# Patient Record
Sex: Female | Born: 1960 | Race: White | Hispanic: No | Marital: Married | Smoking: Never smoker
Health system: Southern US, Community
[De-identification: ages and names within clinical notes are randomized; demographics above are authoritative.]

## PROBLEM LIST (undated history)

## (undated) DIAGNOSIS — G43909 Migraine, unspecified, not intractable, without status migrainosus: Secondary | ICD-10-CM

## (undated) DIAGNOSIS — J302 Other seasonal allergic rhinitis: Secondary | ICD-10-CM

## (undated) DIAGNOSIS — C801 Malignant (primary) neoplasm, unspecified: Secondary | ICD-10-CM

## (undated) DIAGNOSIS — R7303 Prediabetes: Secondary | ICD-10-CM

## (undated) DIAGNOSIS — E785 Hyperlipidemia, unspecified: Secondary | ICD-10-CM

## (undated) DIAGNOSIS — F32A Depression, unspecified: Secondary | ICD-10-CM

## (undated) DIAGNOSIS — J45909 Unspecified asthma, uncomplicated: Secondary | ICD-10-CM

## (undated) DIAGNOSIS — Z853 Personal history of malignant neoplasm of breast: Secondary | ICD-10-CM

## (undated) DIAGNOSIS — F411 Generalized anxiety disorder: Secondary | ICD-10-CM

## (undated) DIAGNOSIS — M1990 Unspecified osteoarthritis, unspecified site: Secondary | ICD-10-CM

## (undated) DIAGNOSIS — C50919 Malignant neoplasm of unspecified site of unspecified female breast: Secondary | ICD-10-CM

## (undated) DIAGNOSIS — F419 Anxiety disorder, unspecified: Secondary | ICD-10-CM

## (undated) DIAGNOSIS — E7800 Pure hypercholesterolemia, unspecified: Secondary | ICD-10-CM

## (undated) DIAGNOSIS — F329 Major depressive disorder, single episode, unspecified: Secondary | ICD-10-CM

## (undated) HISTORY — DX: Hyperlipidemia, unspecified: E78.5

## (undated) HISTORY — DX: Depression, unspecified: F32.A

## (undated) HISTORY — DX: Prediabetes: R73.03

## (undated) HISTORY — DX: Generalized anxiety disorder: F41.1

## (undated) HISTORY — DX: Migraine, unspecified, not intractable, without status migrainosus: G43.909

## (undated) HISTORY — PX: CHOLECYSTECTOMY: SHX55

## (undated) HISTORY — DX: Personal history of malignant neoplasm of breast: Z85.3

## (undated) HISTORY — DX: Other seasonal allergic rhinitis: J30.2

## (undated) HISTORY — DX: Major depressive disorder, single episode, unspecified: F32.9

## (undated) HISTORY — DX: Malignant neoplasm of unspecified site of unspecified female breast: C50.919

## (undated) HISTORY — DX: Pure hypercholesterolemia, unspecified: E78.00

## (undated) HISTORY — DX: Anxiety disorder, unspecified: F41.9

## (undated) HISTORY — DX: Unspecified osteoarthritis, unspecified site: M19.90

## (undated) HISTORY — PX: TUBAL LIGATION: SHX77

## (undated) HISTORY — PX: OTHER SURGICAL HISTORY: SHX169

---

## 2000-03-05 ENCOUNTER — Other Ambulatory Visit: Admission: RE | Admit: 2000-03-05 | Discharge: 2000-03-05 | Payer: Self-pay | Admitting: Family Medicine

## 2015-03-30 ENCOUNTER — Ambulatory Visit: Payer: Self-pay | Admitting: General Surgery

## 2015-03-30 NOTE — H&P (Signed)
History of Present Illness Carly Spruce MD; 03/30/2015 11:33 AM) Patient words: New-Hernia.  The patient is a 55 year old female who presents with an incisional hernia. 55 year old female who has noticed an enlarging bulge in her umbilicus. She had a cholecystectomy performed approximately 10 years ago. She gets early satiety and occasional nausea. The bulge is also uncomfortable causing some odd pains. He has normal bowel movements. She does not smoke she is not a diabetic she has no immunologic diseases. He has not been evaluated for colonoscopy. She has lost 10 pounds in the last month purposefully.  She also notes dysphagia having feelings things get stuck in her mid chest often relieved by vomiting this occurs around 2 times per month. She's not had previous evaluation she has had on EGD performed for ulcer disease in the past. Her dysphagia is both liquids and solids.  Referred by Dr. Rica Records   Other Problems Carly Farmer, CMA; 03/30/2015 10:52 AM) Back Pain Cholelithiasis Gastric Ulcer Gastroesophageal Reflux Disease Hemorrhoids High blood pressure Hypercholesterolemia  Past Surgical History Carly Farmer, CMA; 03/30/2015 10:52 AM) Gallbladder Surgery - Laparoscopic  Diagnostic Studies History Carly Farmer, CMA; 03/30/2015 10:52 AM) Colonoscopy never Pap Smear 1-5 years ago  Allergies Carly Farmer, CMA; 03/30/2015 10:53 AM) Codeine Sulfate *ANALGESICS - OPIOID*  Medication History (Carly Farmer, CMA; 03/30/2015 10:53 AM) Atorvastatin Calcium (20MG  Tablet, Oral) Active. Fluticasone Propionate (50MCG/ACT Suspension, Nasal) Active. Montelukast Sodium (10MG  Tablet, Oral) Active. Vitamin D (Ergocalciferol) (50000UNIT Capsule, Oral) Active. Medications Reconciled  Social History Carly Farmer, CMA; 03/30/2015 10:52 AM) Alcohol use Occasional alcohol use. Caffeine use Tea. No drug use Tobacco use Never smoker.  Family History Carly Farmer, CMA; 03/30/2015 10:52 AM) Arthritis Father, Mother. Heart Disease Father. Hypertension Brother, Father, Mother. Prostate Cancer Father.  Pregnancy / Birth History Carly Farmer, CMA; 03/30/2015 10:52 AM) Age at menarche 72 years. Age of menopause 70-50 Gravida 2 Maternal age 67-25 Para 2    Review of Systems Carly Farmer CMA; 03/30/2015 10:52 AM) General Not Present- Appetite Loss, Chills, Fatigue, Fever, Night Sweats, Weight Gain and Weight Loss. Skin Not Present- Change in Wart/Mole, Dryness, Hives, Jaundice, New Lesions, Non-Healing Wounds, Rash and Ulcer. HEENT Present- Seasonal Allergies and Wears glasses/contact lenses. Not Present- Earache, Hearing Loss, Hoarseness, Nose Bleed, Oral Ulcers, Ringing in the Ears, Sinus Pain, Sore Throat, Visual Disturbances and Yellow Eyes. Cardiovascular Not Present- Chest Pain, Difficulty Breathing Lying Down, Leg Cramps, Palpitations, Rapid Heart Rate, Shortness of Breath and Swelling of Extremities. Gastrointestinal Present- Difficulty Swallowing. Not Present- Abdominal Pain, Bloating, Bloody Stool, Change in Bowel Habits, Chronic diarrhea, Constipation, Excessive gas, Gets full quickly at meals, Hemorrhoids, Indigestion, Nausea, Rectal Pain and Vomiting. Female Genitourinary Present- Urgency. Not Present- Frequency, Nocturia, Painful Urination and Pelvic Pain. Musculoskeletal Present- Joint Pain. Not Present- Back Pain, Joint Stiffness, Muscle Pain, Muscle Weakness and Swelling of Extremities. Neurological Not Present- Decreased Memory, Fainting, Headaches, Numbness, Seizures, Tingling, Tremor, Trouble walking and Weakness. Psychiatric Not Present- Anxiety, Bipolar, Change in Sleep Pattern, Depression, Fearful and Frequent crying. Endocrine Not Present- Cold Intolerance, Excessive Hunger, Hair Changes, Heat Intolerance, Hot flashes and New Diabetes. Hematology Not Present- Easy Bruising, Excessive bleeding, Gland problems, HIV and  Persistent Infections.  Vitals (Carly Farmer CMA; 03/30/2015 10:53 AM) 03/30/2015 10:52 AM Weight: 239.8 lb Height: 67in Body Surface Area: 2.18 m Body Mass Index: 37.56 kg/m  Temp.: 97.46F(Oral)  Pulse: 68 (Regular)  BP: 140/78 (Sitting, Left Arm, Standard)       Physical Exam (Carly Laning A.  Aldea Avis MD; 03/30/2015 11:31 AM) General Mental Status-Alert. General Appearance-Cooperative. Orientation-Oriented X4. Posture-Normal posture.  Integumentary Global Assessment Normal Exam - Head/Face: no rashes, ulcers, lesions or evidence of photo damage. No palpable nodules or masses and Neck: no visible lesions or palpable masses.  Head and Neck Head-normocephalic, atraumatic with no lesions or palpable masses. Face Global Assessment - atraumatic. Thyroid Gland Characteristics - normal size and consistency.  Eye Eyeball - Bilateral-Extraocular movements intact. Sclera/Conjunctiva - Bilateral-No scleral icterus, No Discharge.  ENMT Nose and Sinuses Nose - no deformities observed, no swelling present.  Chest and Lung Exam Palpation Normal exam - Non-tender. Auscultation Breath sounds - Normal.  Cardiovascular Auscultation Rhythm - Regular. Heart Sounds - S1 WNL and S2 WNL. Carotid arteries - No Carotid bruit.  Abdomen Inspection Normal Exam - No Visible peristalsis, No Abnormal pulsations and No Paradoxical movements. Palpation/Percussion Normal exam - Soft, Non Tender, No Rebound tenderness, No Rigidity (guarding), No hepatosplenomegaly and No Palpable abdominal masses. Note: 2 cm periumbilical defect   Peripheral Vascular Upper Extremity Palpation - Pulses bilaterally normal. Lower Extremity Palpation - Edema - Bilateral - No edema.  Neurologic Neurologic evaluation reveals -normal sensation and normal coordination.  Neuropsychiatric Mental status exam performed with findings of-able to articulate well with normal speech/language,  rate, volume and coherence and thought content normal with ability to perform basic computations and apply abstract reasoning.  Musculoskeletal Normal Exam - Bilateral-Upper Extremity Strength Normal and Lower Extremity Strength Normal.    Assessment & Plan Carly Spruce MD; 03/30/2015 11:33 AM) Carly Farmer HERNIA, WITHOUT OBSTRUCTION OR GANGRENE (K43.2) Impression: 77-year-old female with small symptomatic incisional hernia. We discussed the etiologies of hernias that do not: Themselves to take a larger over time that the large especially for people who have obesity, smoke or other diabetic or immunologic diseases. Discussed the risk of obstruction or strangulation his hernias proximally 1% over 5 years. This options for surgical repair and I recommended laparoscopic repair with mesh. Discussed the details of the procedure including risks of recurrence of 10%, risk of infection, risk of intra-abdominal organ injury, risk of bleeding, as well as discomfort with healing. She showed good understanding and wanted to proceed. We discussed the timing of this would be completely up to her and she is elected to wait until approximately mid March is I recommended that she complete her colonoscopy prior to repair in case there is finding the requiring surgery or additional procedure. -Separate incisional hernia repair possible overnight stay Current Plans Pt Education - Carly Farmer - Hernia Surgery: discussed with patient and provided information. The anatomy & physiology of the abdominal wall was discussed. The pathophysiology of hernias was discussed. Natural history risks without surgery including progeressive enlargement, pain, incarceration, & strangulation was discussed. Contributors to complications such as smoking, obesity, diabetes, prior surgery, etc were discussed.  I feel the risks of no intervention will lead to serious problems that outweigh the operative risks; therefore, I recommended  surgery to reduce and repair the hernia. I explained laparoscopic techniques with possible need for an open approach. I noted the probable use of mesh to patch and/or buttress the hernia repair  Risks such as bleeding, infection, abscess, need for further treatment, heart attack, death, and other risks were discussed. I noted a good likelihood this will help address the problem. Goals of post-operative recovery were discussed as well. Possibility that this will not correct all symptoms was explained. I stressed the importance of low-impact activity, aggressive pain control, avoiding constipation, & not  pushing through pain to minimize risk of post-operative chronic pain or injury. Possibility of reherniation especially with smoking, obesity, diabetes, immunosuppression, and other health conditions was discussed. We will work to minimize complications.  An educational handout further explaining the pathology & treatment options was Farmer as well. Questions were answered. The patient expresses understanding & wishes to proceed with surgery.  You are being scheduled for surgery - Our schedulers will call you.  You should hear from our office's scheduling department within 5 working days about the location, date, and time of surgery. We try to make accommodations for patient's preferences in scheduling surgery, but sometimes the OR schedule or the surgeon's schedule prevents [place] from making those accommodations.  If you have not heard from our office [phone]) in 5 working days, call the office and ask for your surgeon's nurse.  If you have other questions about your diagnosis, plan, or surgery, call the office and ask for your surgeon's nurse.  DYSPHAGIA (R13.10) Impression: She has dysphagia for solids and liquids which has not previously been evaluated. She is most interested in working up the hernia at this time but I said it is a good idea to begin evaluation of this as well. Current  Plans BARIUM SWALLOW [phone]) (dysphagia, want view of entire esophagus and at least upper stomach) Follow up with [place] in the office in 1 month.  Call us sooner as needed.

## 2015-04-06 ENCOUNTER — Other Ambulatory Visit: Payer: Self-pay | Admitting: General Surgery

## 2015-04-06 DIAGNOSIS — R4702 Dysphasia: Secondary | ICD-10-CM

## 2017-04-07 DIAGNOSIS — M545 Low back pain: Secondary | ICD-10-CM | POA: Diagnosis not present

## 2017-04-07 DIAGNOSIS — J0100 Acute maxillary sinusitis, unspecified: Secondary | ICD-10-CM | POA: Diagnosis not present

## 2017-04-07 DIAGNOSIS — J209 Acute bronchitis, unspecified: Secondary | ICD-10-CM | POA: Diagnosis not present

## 2017-04-10 DIAGNOSIS — J209 Acute bronchitis, unspecified: Secondary | ICD-10-CM | POA: Diagnosis not present

## 2017-04-10 DIAGNOSIS — J0100 Acute maxillary sinusitis, unspecified: Secondary | ICD-10-CM | POA: Diagnosis not present

## 2017-06-11 DIAGNOSIS — S239XXA Sprain of unspecified parts of thorax, initial encounter: Secondary | ICD-10-CM | POA: Diagnosis not present

## 2017-09-21 DIAGNOSIS — G43909 Migraine, unspecified, not intractable, without status migrainosus: Secondary | ICD-10-CM | POA: Diagnosis not present

## 2017-09-21 DIAGNOSIS — M1990 Unspecified osteoarthritis, unspecified site: Secondary | ICD-10-CM | POA: Diagnosis not present

## 2017-11-03 DIAGNOSIS — K219 Gastro-esophageal reflux disease without esophagitis: Secondary | ICD-10-CM | POA: Diagnosis not present

## 2017-11-03 DIAGNOSIS — M25552 Pain in left hip: Secondary | ICD-10-CM | POA: Diagnosis not present

## 2017-11-03 DIAGNOSIS — Z0000 Encounter for general adult medical examination without abnormal findings: Secondary | ICD-10-CM | POA: Diagnosis not present

## 2017-11-06 ENCOUNTER — Other Ambulatory Visit: Payer: Self-pay | Admitting: Student

## 2017-11-06 DIAGNOSIS — Z1231 Encounter for screening mammogram for malignant neoplasm of breast: Secondary | ICD-10-CM

## 2017-11-30 DIAGNOSIS — R7303 Prediabetes: Secondary | ICD-10-CM | POA: Diagnosis not present

## 2017-11-30 DIAGNOSIS — E782 Mixed hyperlipidemia: Secondary | ICD-10-CM | POA: Diagnosis not present

## 2017-12-09 ENCOUNTER — Ambulatory Visit
Admission: RE | Admit: 2017-12-09 | Discharge: 2017-12-09 | Disposition: A | Discharge status: Home / Self Care | Payer: 59 | Source: Ambulatory Visit | Attending: Student | Admitting: Student

## 2017-12-09 ENCOUNTER — Encounter: Payer: Self-pay | Admitting: Radiology

## 2017-12-09 DIAGNOSIS — Z1231 Encounter for screening mammogram for malignant neoplasm of breast: Secondary | ICD-10-CM

## 2017-12-09 DIAGNOSIS — K648 Other hemorrhoids: Secondary | ICD-10-CM | POA: Diagnosis not present

## 2017-12-10 ENCOUNTER — Other Ambulatory Visit: Payer: Self-pay | Admitting: Student

## 2017-12-10 DIAGNOSIS — R928 Other abnormal and inconclusive findings on diagnostic imaging of breast: Secondary | ICD-10-CM

## 2017-12-14 ENCOUNTER — Ambulatory Visit
Admission: RE | Admit: 2017-12-14 | Discharge: 2017-12-14 | Disposition: A | Discharge status: Home / Self Care | Payer: 59 | Source: Ambulatory Visit | Attending: Student | Admitting: Student

## 2017-12-14 ENCOUNTER — Other Ambulatory Visit: Payer: Self-pay | Admitting: Student

## 2017-12-14 DIAGNOSIS — N651 Disproportion of reconstructed breast: Secondary | ICD-10-CM | POA: Diagnosis not present

## 2017-12-14 DIAGNOSIS — R928 Other abnormal and inconclusive findings on diagnostic imaging of breast: Secondary | ICD-10-CM

## 2017-12-14 DIAGNOSIS — N6489 Other specified disorders of breast: Secondary | ICD-10-CM

## 2017-12-15 DIAGNOSIS — C801 Malignant (primary) neoplasm, unspecified: Secondary | ICD-10-CM

## 2017-12-15 HISTORY — DX: Malignant (primary) neoplasm, unspecified: C80.1

## 2017-12-18 ENCOUNTER — Ambulatory Visit
Admission: RE | Admit: 2017-12-18 | Discharge: 2017-12-18 | Disposition: A | Discharge status: Home / Self Care | Payer: 59 | Source: Ambulatory Visit | Attending: Student | Admitting: Student

## 2017-12-18 DIAGNOSIS — N6322 Unspecified lump in the left breast, upper inner quadrant: Secondary | ICD-10-CM | POA: Diagnosis not present

## 2017-12-18 DIAGNOSIS — N6489 Other specified disorders of breast: Secondary | ICD-10-CM

## 2017-12-18 DIAGNOSIS — C50212 Malignant neoplasm of upper-inner quadrant of left female breast: Secondary | ICD-10-CM | POA: Diagnosis not present

## 2017-12-22 ENCOUNTER — Telehealth: Payer: Self-pay | Admitting: Hematology and Oncology

## 2017-12-22 NOTE — Telephone Encounter (Signed)
LVM for patient in reference to morning BC on 10/16, advised packet sent both e-mail and mail.

## 2017-12-23 ENCOUNTER — Telehealth: Payer: Self-pay | Admitting: Hematology and Oncology

## 2017-12-23 ENCOUNTER — Encounter: Payer: Self-pay | Admitting: *Deleted

## 2017-12-23 DIAGNOSIS — C50212 Malignant neoplasm of upper-inner quadrant of left female breast: Secondary | ICD-10-CM | POA: Insufficient documentation

## 2017-12-23 DIAGNOSIS — Z170 Estrogen receptor positive status [ER+]: Principal | ICD-10-CM

## 2017-12-23 NOTE — Telephone Encounter (Signed)
Patient returned call to confirm the morning [facility] appointment on 10/16, advised to send packet through mail

## 2017-12-24 DIAGNOSIS — Z6836 Body mass index (BMI) 36.0-36.9, adult: Secondary | ICD-10-CM | POA: Diagnosis not present

## 2017-12-24 DIAGNOSIS — C50919 Malignant neoplasm of unspecified site of unspecified female breast: Secondary | ICD-10-CM | POA: Diagnosis not present

## 2017-12-24 DIAGNOSIS — E669 Obesity, unspecified: Secondary | ICD-10-CM | POA: Diagnosis not present

## 2017-12-30 ENCOUNTER — Other Ambulatory Visit: Payer: Self-pay

## 2017-12-30 ENCOUNTER — Telehealth: Payer: Self-pay | Admitting: Hematology and Oncology

## 2017-12-30 ENCOUNTER — Encounter: Payer: Self-pay | Admitting: Hematology and Oncology

## 2017-12-30 ENCOUNTER — Encounter: Payer: Self-pay | Admitting: Physical Therapy

## 2017-12-30 ENCOUNTER — Ambulatory Visit: Payer: 59 | Attending: General Surgery | Admitting: Physical Therapy

## 2017-12-30 ENCOUNTER — Inpatient Hospital Stay: Payer: 59

## 2017-12-30 ENCOUNTER — Ambulatory Visit: Payer: Self-pay | Admitting: General Surgery

## 2017-12-30 ENCOUNTER — Ambulatory Visit
Admission: RE | Admit: 2017-12-30 | Discharge: 2017-12-30 | Disposition: A | Discharge status: Home / Self Care | Payer: 59 | Source: Ambulatory Visit | Attending: Radiation Oncology | Admitting: Radiation Oncology

## 2017-12-30 ENCOUNTER — Inpatient Hospital Stay: Payer: 59 | Attending: Hematology and Oncology | Admitting: Hematology and Oncology

## 2017-12-30 ENCOUNTER — Encounter: Payer: Self-pay | Admitting: *Deleted

## 2017-12-30 DIAGNOSIS — C50212 Malignant neoplasm of upper-inner quadrant of left female breast: Secondary | ICD-10-CM

## 2017-12-30 DIAGNOSIS — Z170 Estrogen receptor positive status [ER+]: Principal | ICD-10-CM

## 2017-12-30 DIAGNOSIS — F329 Major depressive disorder, single episode, unspecified: Secondary | ICD-10-CM | POA: Diagnosis not present

## 2017-12-30 DIAGNOSIS — C50912 Malignant neoplasm of unspecified site of left female breast: Secondary | ICD-10-CM | POA: Diagnosis not present

## 2017-12-30 DIAGNOSIS — R293 Abnormal posture: Secondary | ICD-10-CM | POA: Insufficient documentation

## 2017-12-30 DIAGNOSIS — Z803 Family history of malignant neoplasm of breast: Secondary | ICD-10-CM | POA: Diagnosis not present

## 2017-12-30 DIAGNOSIS — F419 Anxiety disorder, unspecified: Secondary | ICD-10-CM | POA: Diagnosis not present

## 2017-12-30 LAB — CBC WITH DIFFERENTIAL (CANCER CENTER ONLY)
Abs Immature Granulocytes: 0 10*3/uL (ref 0.00–0.07)
BASOS PCT: 1 %
Basophils Absolute: 0.1 10*3/uL (ref 0.0–0.1)
EOS ABS: 0.2 10*3/uL (ref 0.0–0.5)
EOS PCT: 4 %
HEMATOCRIT: 40.8 % (ref 36.0–46.0)
Hemoglobin: 12.9 g/dL (ref 12.0–15.0)
Immature Granulocytes: 0 %
LYMPHS ABS: 1.9 10*3/uL (ref 0.7–4.0)
Lymphocytes Relative: 35 %
MCH: 28.9 pg (ref 26.0–34.0)
MCHC: 31.6 g/dL (ref 30.0–36.0)
MCV: 91.5 fL (ref 80.0–100.0)
MONOS PCT: 7 %
Monocytes Absolute: 0.4 10*3/uL (ref 0.1–1.0)
NRBC: 0 % (ref 0.0–0.2)
Neutro Abs: 2.8 10*3/uL (ref 1.7–7.7)
Neutrophils Relative %: 53 %
Platelet Count: 231 10*3/uL (ref 150–400)
RBC: 4.46 MIL/uL (ref 3.87–5.11)
RDW: 13.1 % (ref 11.5–15.5)
WBC Count: 5.3 10*3/uL (ref 4.0–10.5)

## 2017-12-30 LAB — CMP (CANCER CENTER ONLY)
ALBUMIN: 3.6 g/dL (ref 3.5–5.0)
ALT: 14 U/L (ref 0–44)
AST: 12 U/L — ABNORMAL LOW (ref 15–41)
Alkaline Phosphatase: 75 U/L (ref 38–126)
Anion gap: 9 (ref 5–15)
BUN: 12 mg/dL (ref 6–20)
CALCIUM: 9.2 mg/dL (ref 8.9–10.3)
CO2: 27 mmol/L (ref 22–32)
CREATININE: 1.07 mg/dL — AB (ref 0.44–1.00)
Chloride: 106 mmol/L (ref 98–111)
GFR, Estimated: 57 mL/min — ABNORMAL LOW (ref >60)
Glucose, Bld: 110 mg/dL — ABNORMAL HIGH (ref 70–99)
Potassium: 3.7 mmol/L (ref 3.5–5.1)
SODIUM: 142 mmol/L (ref 135–145)
TOTAL PROTEIN: 6.5 g/dL (ref 6.5–8.1)
Total Bilirubin: 0.5 mg/dL (ref 0.3–1.2)

## 2017-12-30 NOTE — Assessment & Plan Note (Signed)
12/18/2017:Screening mammogram detected left breast distortion at 11 o'clock position measured 8 mm by ultrasound.  Axilla negative.  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  T1bN0 stage Ia  Pathology and radiology counseling:Discussed with the patient, the details of pathology including the type of breast cancer,the clinical staging, the significance of ER, PR and HER-2/neu receptors and the implications for treatment. After reviewing the pathology in detail, we proceeded to discuss the different treatment options between surgery, radiation, chemotherapy, antiestrogen therapies.  Recommendations: 1. Breast conserving surgery with sentinel lymph node biopsy followed by 2. Oncotype DX testing to determine if chemotherapy would be of any benefit followed by 3. Adjuvant radiation therapy followed by 4. Adjuvant antiestrogen therapy  Oncotype counseling: I discussed Oncotype DX test. I explained to the patient that this is a 21 gene panel to evaluate patient tumors DNA to calculate recurrence score. This would help determine whether patient has high risk or intermediate risk or low risk breast cancer. She understands that if her tumor was found to be high risk, she would benefit from systemic chemotherapy. If low risk, no need of chemotherapy. If she was found to be intermediate risk, we would need to evaluate the score as well as other risk factors and determine if an abbreviated chemotherapy may be of benefit.  Return to clinic after surgery to discuss final pathology report and then determine if Oncotype DX testing will need to be sent.

## 2017-12-30 NOTE — Therapy (Signed)
[place], [place], [address] Phone: [phone]   Fax:  [phone]  Physical Therapy Evaluation  Patient Details  Name: Carly Farmer MRN: [id number] Date of Birth: 05/03/1960 Referring Provider (PT): Dr. Excell Seltzer   Encounter Date: 12/30/2017  PT End of Session - 12/30/17 1106    Visit Number  1    Number of Visits  2    Date for PT Re-Evaluation  02/24/18    PT Start Time  0917    PT Stop Time  0944    PT Time Calculation (min)  27 min    Activity Tolerance  Patient tolerated treatment well    Behavior During Therapy  [facility] for tasks assessed/performed       Past Medical History:  Diagnosis Date  . Anxiety   . Arthritis   . Depression   . High cholesterol     Past Surgical History:  Procedure Laterality Date  . CHOLECYSTECTOMY    . TUBAL LIGATION    . wisdom teeth removal      There were no vitals filed for this visit.   Subjective Assessment - 12/30/17 1043    Subjective  Patient reports she is here today to be seen by her medical team for her newly diagnosed left breast cancer.    Patient is accompained by:  Family member    Pertinent History  Patient was diagnosed on 12/09/17 with left grade II invasive ductal carcinoma breast cancer. It emasures 8 mm and is located in the upper inner quadrant. It is ER/PR positive and HER2 negative with a Ki67 of 12%. She has no other known medical problems.    Patient Stated Goals  Reduce lymphedema risk and learn post op shoulder ROM HEP    Currently in Pain?  Yes    Pain Score  5     Pain Location  Hip    Pain Orientation  Right;Left    Pain Descriptors / Indicators  Throbbing    Pain Type  Chronic pain    Pain Radiating Towards  back    Pain Onset  More than a month ago    Pain Frequency  Intermittent    Aggravating Factors   Walking and standing    Pain Relieving Factors  Rest         [facility] PT Assessment - 12/30/17 0001       Assessment   Medical Diagnosis  Left breast cancer    Referring Provider (PT)  Dr. Excell Seltzer    Onset Date/Surgical Date  12/09/17    Hand Dominance  Right    Prior Therapy  none      Precautions   Precautions  Other (comment)    Precaution Comments  Active cancer      Restrictions   Weight Bearing Restrictions  No      Balance Screen   Has the patient fallen in the past 6 months  No    Has the patient had a decrease in activity level because of a fear of falling?   No    Is the patient reluctant to leave their home because of a fear of falling?   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, walks alot and climbs ladders    Leisure  She does not exercise      Cognition   Overall Cognitive Status  Within Functional Limits for tasks assessed      Posture/Postural Control   Posture/Postural Control  Postural limitations    Postural Limitations  Rounded Shoulders;Forward head      ROM / Strength   AROM / PROM / Strength  AROM;Strength      AROM   AROM Assessment Site  Shoulder;Cervical    Right/Left Shoulder  Right;Left    Right Shoulder Extension  60 Degrees    Right Shoulder Flexion  168 Degrees    Right Shoulder ABduction  179 Degrees    Right Shoulder Internal Rotation  68 Degrees    Right Shoulder External Rotation  90 Degrees    Left Shoulder Extension  56 Degrees    Left Shoulder Flexion  169 Degrees    Left Shoulder ABduction  180 Degrees    Left Shoulder Internal Rotation  67 Degrees    Left Shoulder External Rotation  89 Degrees    Cervical Flexion  WNL    Cervical Extension  WNL    Cervical - Right Side Bend  WNL    Cervical - Left Side Bend   WNL    Cervical - Right Rotation  WNL    Cervical - Left Rotation  WNL      Strength   Overall Strength  Within functional limits for tasks performed        LYMPHEDEMA/ONCOLOGY QUESTIONNAIRE - 12/30/17 1104      Type   Cancer Type  Left breast cancer      Lymphedema Assessments   Lymphedema Assessments  Upper extremities      Right Upper Extremity Lymphedema   10 cm Proximal to Olecranon Process  32.2 cm    Olecranon Process  26.6 cm    10 cm Proximal to Ulnar Styloid Process  26.5 cm    Just Proximal to Ulnar Styloid Process  18.6 cm    Across Hand at [employer]  20 cm    At [place] of 2nd Digit  6.6 cm      Left Upper Extremity Lymphedema   10 cm Proximal to Olecranon Process  33.2 cm    Olecranon Process  26.9 cm    10 cm Proximal to Ulnar Styloid Process  25.4 cm    Just Proximal to Ulnar Styloid Process  18.5 cm    Across Hand at [employer]  19.8 cm    At [place] of 2nd Digit  6.5 cm             Objective measurements completed on examination: See above findings.     Patient was instructed today in a home exercise program today for post op shoulder range of motion. These included active assist shoulder flexion in sitting, scapular retraction, wall walking with shoulder abduction, and hands behind head external rotation.  She was encouraged to do these twice a day, holding 3 seconds and repeating 5 times when permitted by her physician.       PT Education - 12/30/17 1106    Education Details  Lymphedema risk reduction and post op shoulder ROM HEP    Person(s) Educated  Patient;Spouse    Methods  Explanation;Demonstration;Handout    Comprehension  Verbalized understanding;Returned demonstration          PT Long Term Goals - 12/30/17 1118      PT LONG TERM GOAL #1   Title  Patient will demonstrate she ha regained full shoulder ROM and function post operatively compared to baseline measurements.    Time  8    Period  Weeks    Status  New       Breast Clinic Goals - 12/30/17 1118      Patient will be able to verbalize understanding of pertinent lymphedema risk reduction practices relevant to her diagnosis specifically related to skin care.   Time  1    Period  Days    Status  Achieved      Patient will be able to return demonstrate and/or verbalize understanding of the post-op home exercise program related to regaining shoulder range of motion.   Time  1    Period  Days    Status  Achieved      Patient will be able to verbalize understanding of the importance of attending the postoperative After Breast Cancer Class for further lymphedema risk reduction education and therapeutic exercise.   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 It measures 8 mm and is located in the upper inner quadrant. It is ER/PR positive and HER2 negative with a Ki67 of 12%. She has no other known medical problems. Her multidisciplinary medical team met prior to her assessments to determine a recommended treatment plan. She is planning to have a left lumpectomy and sentinel node biopsy followed by Oncotype testing, radiation, and anti-estrogen therapy. She will benefit from a post op PT visit to reassess and determine needs.    Clinical Presentation  Stable    Clinical Decision Making  Low    Rehab Potential  Excellent    Clinical Impairments Affecting Rehab Potential  None    PT Frequency  --   Eval and 1 f/u visit   PT Treatment/Interventions  ADLs/Self Care Home Management;Therapeutic exercise;Patient/family education    PT Next Visit Plan  Will reassess 3-4 weeks post op    PT Home Exercise Plan  Post op shoulder ROM HEP    Consulted and Agree with Plan of Care  Patient;Family member/caregiver    Family Member Consulted  Husband and daughter       Patient will benefit from  skilled therapeutic intervention in order to improve the following deficits and impairments:  Decreased range of motion, Postural dysfunction, Pain, Impaired UE functional use, Decreased knowledge of precautions  Visit Diagnosis: Malignant neoplasm of upper-inner quadrant of left breast in female, estrogen receptor positive ([place]) - Plan: PT plan of care cert/re-cert  Abnormal posture - Plan: PT plan of care cert/re-cert   Patient will follow up at outpatient cancer rehab 3-4 weeks following surgery.  If the patient requires physical therapy at that time, a specific plan will be dictated and sent to the referring physician for approval. The patient was educated today on appropriate basic range of motion exercises to begin post operatively and the importance of attending the After Breast Cancer class following surgery.  Patient was educated today on lymphedema risk reduction practices as it pertains to recommendations that will benefit the patient  immediately following surgery.  She verbalized good understanding.      Problem List Patient Active Problem List   Diagnosis Date Noted  . Malignant neoplasm of upper-inner quadrant of left breast in female, estrogen receptor positive ([place]) 12/23/2017    Annia Friendly, PT 12/30/17 11:20 AM  [place], [place], [address] Phone: [phone]   Fax:  [phone]  Name: Carly Farmer MRN: [id number] Date of Birth: July 07, 1960

## 2017-12-30 NOTE — Progress Notes (Signed)
Nutrition Assessment  Reason for Assessment:  Pt seen in Breast Clinic  ASSESSMENT:   57 year old female with new diagnosis of breast cancer.  Past medical history reviewed.   Medications:  reviewed  Labs: reviewed  Anthropometrics:   Height: 66 inches Weight: 232 lb  BMI: 37.5   NUTRITION DIAGNOSIS: Food and nutrition related knowledge deficit related to new diagnosis of breast cancer as evidenced by no prior need for nutrition related information.  INTERVENTION:   Discussed and provided packet of information regarding nutritional tips for breast cancer patients.  Questions answered.  Teachback method used.  Contact information provided and patient knows to contact me with questions/concerns.    MONITORING, EVALUATION, and GOAL: Pt will consume a healthy plant based diet to maintain lean body mass throughout treatment.   Tammy Ericsson B. Zenia Resides, [place], [place] Registered Dietitian [phone] (pager)

## 2017-12-30 NOTE — Telephone Encounter (Signed)
No 10/16 los orders.  °

## 2017-12-30 NOTE — Progress Notes (Signed)
Clinical Social Work [place] Psychosocial Distress Screening [place]  Patient completed distress screening protocol and scored a 5 on the Psychosocial Distress Thermometer which indicates moderate distress. Clinical Social Worker met with patient and patients family in [facility] to assess for distress and other psychosocial needs. Patient stated she was feeling overwhelmed but felt "better" after meeting with the treatment team and getting more information on her treatment plan. CSW and patient discussed common feeling and emotions when being diagnosed with cancer, and the importance of support during treatment. CSW informed patient of the support team and support services at [facility]. CSW provided contact information and encouraged patient to call with any questions or concerns.  ONCBCN DISTRESS SCREENING 12/30/2017  Screening Type Initial Screening  Distress experienced in past week (1-10) 5  Emotional problem type Depression;Nervousness/Anxiety  Information Concerns Type Lack of info about diagnosis;Lack of info about treatment     Johnnye Lana, MSW, LCSW, OSW-C Clinical Social Worker [facility] [phone]

## 2017-12-30 NOTE — Patient Instructions (Signed)

## 2017-12-30 NOTE — Progress Notes (Signed)
Radiation Oncology         [phone] ________________________________  Multidisciplinary Breast Oncology Clinic [facility]) Initial Outpatient Consultation  Name: Carly Farmer MRN: [id number]  Date: 12/30/2017  DOB: 03/22/1960  KV:[id number], Geni Bers, NP  Excell Seltzer, MD   REFERRING PHYSICIAN: Excell Seltzer, MD  DIAGNOSIS: The encounter diagnosis was Malignant neoplasm of upper-inner quadrant of left breast in female, estrogen receptor positive ([place]). 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 female who is presenting to the office today for evaluation of her newly diagnosed breast cancer. She is doing well overall.   She had routine screening mammography on 12/09/2017 showing: possible distortion in the left breast. She underwent unilateral left diagnostic mammography with tomography and left breast ultrasonography at [facility] on 12/14/2017 showing: UPPER INNER LEFT breast distortion with 0.8 cm sonographic correlate. Tissue sampling is recommended. No abnormal appearing LEFT axillary lymph nodes  Accordingly on 12/18/2017 she proceeded to biopsy of the left breast area in question. The pathology from this procedure showed: Breast, left, needle core biopsy, 11 o'clock with invasive ductal carcinoma. Ductal carcinoma in situ. Prognostic indicators significant for: estrogen receptor, 100% positive and progesterone receptor, 90% positive, both with strong staining intensity. Proliferation marker Ki67 at 12%. HER2 negative.  On review of systems, She reports exertional SOB (when walking up stairs), back  pain, joint pain, anxiety, and depression. She denies any other symptoms. She denies a family hx of breast cancer.    Menarche: 57 years old Age at first live birth: 57 years old GP: GxP2 LMP: 2011 Contraceptive: Yes, for 2 years HRT: No   The patient was referred today for presentation in the multidisciplinary conference.  Radiology studies and pathology slides were presented there for review and discussion of treatment options.  A consensus was discussed regarding potential next steps.  PREVIOUS RADIATION THERAPY: No  PAST MEDICAL HISTORY:  has a past medical history of Anxiety, Arthritis, Depression, and High cholesterol.    PAST SURGICAL HISTORY: Past Surgical History:  Procedure Laterality Date  . CHOLECYSTECTOMY    . TUBAL LIGATION    . wisdom teeth removal      FAMILY HISTORY: family history includes Bone cancer in her father; Breast cancer in her maternal aunt; Prostate cancer in her father.  SOCIAL HISTORY:  reports that she has never smoked. She does not have any smokeless tobacco history on file. She reports that she drinks alcohol. She reports that she does not use drugs.  ALLERGIES: Codeine  MEDICATIONS:  Current Outpatient Medications  Medication Sig Dispense Refill  . phentermine 37.5 MG capsule Take 37.5 mg by mouth every morning.    . sertraline (ZOLOFT) 25 MG tablet Take 25 mg by mouth daily.     No current facility-administered medications for this encounter.     REVIEW OF SYSTEMS:  REVIEW OF SYSTEMS: A 10+ POINT REVIEW OF SYSTEMS WAS OBTAINED including neurology, dermatology, psychiatry, cardiac, respiratory, lymph, extremities, GI, GU, musculoskeletal, constitutional, reproductive, HEENT. All pertinent positives are noted in the HPI. All others are negative.   PHYSICAL EXAM:  Vitals with BMI 12/30/2017  Height _0   Weight 232 lbs 3 oz  BMI 58.5  Systolic 277  Diastolic 75  Pulse 74  Respirations 20    Lungs are clear to auscultation  bilaterally. Heart has regular rate and rhythm. No palpable cervical, supraclavicular, or axillary adenopathy. Abdomen soft, non-tender, normal bowel sounds. Breast: Right breast large and pendulous without mass, nipple discharge, or bleeding. Left breast large and pendulous. Some bruising noted in the 11 o'clock position. No palpable mass, nipple discharge, or bleeding.    KPS = 100   100 - Normal; no complaints; no evidence of disease. 90   - Able to carry on normal activity; minor signs or symptoms of disease. 80   - Normal activity with effort; some signs or symptoms of disease. 42   - Cares for self; unable to carry on normal activity or to do active work. 60   - Requires occasional assistance, but is able to care for most of his personal needs. 50   - Requires considerable assistance and frequent medical care. 49   - Disabled; requires special care and assistance. 24   - Severely disabled; hospital admission is indicated although death not imminent. 47   - Very sick; hospital admission necessary; active supportive treatment necessary. 10   - Moribund; fatal processes progressing rapidly. 0     - Dead  Karnofsky DA, Abelmann Shokan, Craver LS and Burchenal JH [phone]) The use of the nitrogen mustards in the palliative treatment of carcinoma: with particular reference to bronchogenic carcinoma Cancer 1 [id number]  LABORATORY DATA:  Lab Results  Component Value Date   WBC 5.3 12/30/2017   HGB 12.9 12/30/2017   HCT 40.8 12/30/2017   MCV 91.5 12/30/2017   PLT 231 12/30/2017   Lab Results  Component Value Date   NA 142 12/30/2017   K 3.7 12/30/2017   CL 106 12/30/2017   CO2 27 12/30/2017   Lab Results  Component Value Date   ALT 14 12/30/2017   AST 12 (L) 12/30/2017   ALKPHOS 75 12/30/2017   BILITOT 0.5 12/30/2017    RADIOGRAPHY: US Breast Ltd Uni Left Inc Axilla  Result Date: 12/14/2017 CLINICAL DATA:  57 year old female for further evaluation of possible LEFT breast distortion  on new baseline screening mammogram EXAM: DIGITAL DIAGNOSTIC LEFT MAMMOGRAM WITH TOMO ULTRASOUND LEFT BREAST COMPARISON:  12/09/2017 screening mammogram ACR Breast Density Category b: There are scattered areas of fibroglandular density. FINDINGS: 2D/3D spot compression views of the LEFT breast demonstrate an area of persistent distortion within the UPPER INNER LEFT breast. Targeted ultrasound is performed, showing a 0.8 x 0.4 x 0.7 cm hypoechoic area of distortion at the 11 o'clock position of the LEFT breast corresponds to the distortion identified mammographically. No abnormal LEFT axillary lymph nodes are noted. IMPRESSION: UPPER INNER LEFT breast distortion with 0.8 cm sonographic correlate. Tissue sampling is recommended. No abnormal appearing LEFT axillary lymph nodes. RECOMMENDATION: Ultrasound-guided guided LEFT breast biopsy, which will be arranged. I have discussed the findings and recommendations with the patient. Results were also provided in writing at the conclusion of the visit. If applicable, a reminder letter will be sent to the patient regarding the next appointment. BI-RADS CATEGORY  4: Suspicious. Electronically Signed   By: Margarette Canada M.D.   On: 12/14/2017 15:13   Mm Diag Breast Tomo Uni Left  Result Date: 12/14/2017 CLINICAL DATA:  57 year old female for further evaluation of possible LEFT breast distortion on new baseline screening mammogram EXAM: DIGITAL DIAGNOSTIC LEFT MAMMOGRAM WITH TOMO ULTRASOUND LEFT  BREAST COMPARISON:  12/09/2017 screening mammogram ACR Breast Density Category b: There are scattered areas of fibroglandular density. FINDINGS: 2D/3D spot compression views of the LEFT breast demonstrate an area of persistent distortion within the UPPER INNER LEFT breast. Targeted ultrasound is performed, showing a 0.8 x 0.4 x 0.7 cm hypoechoic area of distortion at the 11 o'clock position of the LEFT breast corresponds to the distortion identified mammographically. No abnormal LEFT  axillary lymph nodes are noted. IMPRESSION: UPPER INNER LEFT breast distortion with 0.8 cm sonographic correlate. Tissue sampling is recommended. No abnormal appearing LEFT axillary lymph nodes. RECOMMENDATION: Ultrasound-guided guided LEFT breast biopsy, which will be arranged. I have discussed the findings and recommendations with the patient. Results were also provided in writing at the conclusion of the visit. If applicable, a reminder letter will be sent to the patient regarding the next appointment. BI-RADS CATEGORY  4: Suspicious. Electronically Signed   By: Margarette Canada M.D.   On: 12/14/2017 15:13   Mm 3d Screen Breast Bilateral  Result Date: 12/09/2017 CLINICAL DATA:  Screening. EXAM: DIGITAL SCREENING BILATERAL MAMMOGRAM WITH TOMO AND CAD COMPARISON:  None. ACR Breast Density Category b: There are scattered areas of fibroglandular density. FINDINGS: In the left breast, possible distortion warrants further evaluation. In the right breast, no findings suspicious for malignancy. Images were processed with CAD. IMPRESSION: Further evaluation is suggested for possible distortion in the left breast. RECOMMENDATION: Diagnostic mammogram and possibly ultrasound of the left breast. (Code:[id number]-L-[id number]) The patient will be contacted regarding the findings, and additional imaging will be scheduled. BI-RADS CATEGORY  0: Incomplete. Need additional imaging evaluation and/or prior mammograms for comparison. Electronically Signed   By: Ammie Ferrier M.D.   On: 12/09/2017 14:01   Mm Clip Placement Left  Result Date: 12/18/2017 CLINICAL DATA:  Post biopsy mammogram of the left breast for clip placement. EXAM: DIAGNOSTIC LEFT MAMMOGRAM POST ULTRASOUND BIOPSY COMPARISON:  Previous exam(s). FINDINGS: Mammographic images were obtained following ultrasound guided biopsy of a mass in the upper inner left breast. The ribbon shaped biopsy marking clip is appropriately positioned at the site of biopsy in the upper inner left  breast. IMPRESSION: Appropriate positioning of the ribbon shaped biopsy marking clip in the upper inner left breast. Final Assessment: Post Procedure Mammograms for Marker Placement Electronically Signed   By: Ammie Ferrier M.D.   On: 12/18/2017 13:45   [place] Lt Breast Bx W Loc Dev 1st Lesion Img Bx Spec US Guide  Addendum Date: 12/23/2017   ADDENDUM REPORT: 12/21/2017 12:58 ADDENDUM: Pathology revealed GRADE II INVASIVE DUCTAL CARCINOMA, DUCTAL CARCINOMA IN SITU, of LEFT breast, 11 o'clock. This was found to be concordant by Dr. Ammie Ferrier. Pathology results were discussed with the patient by telephone. The patient reported doing well after the biopsy with tenderness at the site. Post biopsy instructions and care were reviewed and questions were answered. The patient was encouraged to call [facility] for any additional concerns. The patient was referred to [facility] on December 30, 2017. Pathology results reported by Roselind Messier, RN on 12/21/2017. Electronically Signed   By: Ammie Ferrier M.D.   On: 12/21/2017 12:58   Result Date: 12/21/2017 CLINICAL DATA:  57 year old female presenting for ultrasound-guided biopsy of a left breast mass. EXAM: ULTRASOUND GUIDED LEFT BREAST CORE NEEDLE BIOPSY COMPARISON:  Previous exam(s). FINDINGS: I met with the patient and we discussed the procedure of ultrasound-guided biopsy, including benefits and  alternatives. We discussed the high likelihood of a successful procedure. We discussed the risks of the procedure, including infection, bleeding, tissue injury, clip migration, and inadequate sampling. Informed written consent was given. The usual time-out protocol was performed immediately prior to the procedure. Lesion quadrant: Upper inner quadrant Using sterile technique and 1% Lidocaine as local anesthetic, under direct ultrasound visualization, a 14 gauge  spring-loaded device was used to perform biopsy of a left breast mass at 11 o'clock using a lateral approach. At the conclusion of the procedure a ribbon shaped tissue marker clip was deployed into the biopsy cavity. Follow up 2 view mammogram was performed and dictated separately. IMPRESSION: Ultrasound guided biopsy of left breast mass at 11 o'clock. No apparent complications. Electronically Signed: By: Ammie Ferrier M.D. 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   Today, I talked to the patient and family about the findings and work-up thus far.  We discussed the natural history of invasive ductal carcinoma and general treatment, highlighting the role of radiotherapy in the management.  We discussed the available radiation techniques, and focused on the details of logistics and delivery.  We reviewed the anticipated acute and late sequelae associated with radiation in this setting.  The patient was encouraged to ask questions that I answered to the best of my ability.    PLAN:  1. Left lumpectomy with sentinel node procedure 2. Oncotype Dx 3. Adjuvant Radiation Therapy 4. Aromatase Inhibitor   ------------------------------------------------  Blair Promise, PhD, MD   This document serves as a record of services personally performed by Gery Pray, MD. It was created on his behalf by Steva Colder, a trained medical scribe. The creation of this record is based on the scribe's personal observations and the provider's statements to them. This document has been checked and approved by the attending provider.

## 2017-12-30 NOTE — Progress Notes (Signed)
[place] NOTE  Patient Care Team: Jaclynn Major, NP as PCP - General (Nurse Practitioner) Excell Seltzer, MD as Consulting Physician (General Surgery) Nicholas Lose, MD as Consulting Physician (Hematology and Oncology) Gery Pray, MD as Consulting Physician (Radiation Oncology)  CHIEF COMPLAINTS/PURPOSE OF CONSULTATION:  Newly diagnosed breast cancer  HISTORY OF PRESENTING ILLNESS:  Carly Farmer 57 y.o. female is here because of recent diagnosis of left breast cancer.  Patient did not have a screening mammogram for the last 7 to 8 years and at the insistence of her husband and daughter she underwent a mammogram that detected a left breast distortion.  This was evaluated by ultrasound and she had a 8 mm lesion at 11 o'clock position.  Axilla was negative.  Biopsy of this lesion came back as grade 2 invasive ductal carcinoma with DCIS that was ER 100% PR 90% Ki-67 12% and HER-2 negative.  She was presented at the multidisciplinary tumor board and she is here today accompanied by her husband and daughter to discuss her treatment plan.  I reviewed her records extensively and collaborated the history with the patient.  SUMMARY OF ONCOLOGIC HISTORY:   Malignant neoplasm of upper-inner quadrant of left breast in female, estrogen receptor positive ([place])   12/18/2017 Initial Diagnosis    Screening mammogram detected left breast distortion at 11 o'clock position measured 8 mm by ultrasound.  Axilla negative.  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  T1bN0 stage Ia      MEDICAL HISTORY:  Past Medical History:  Diagnosis Date  . Anxiety   . Arthritis   . Depression   . High cholesterol     SURGICAL HISTORY: Past Surgical History:  Procedure Laterality Date  . CHOLECYSTECTOMY    . TUBAL LIGATION    . wisdom teeth removal      SOCIAL HISTORY: Social History   Socioeconomic History   . Marital status: Married    Spouse name: Not on file  . Number of children: Not on file  . Years of education: Not on file  . Highest education level: Not on file  Occupational History  . Not on file  Social Needs  . Financial resource strain: Not on file  . Food insecurity:    Worry: Not on file    Inability: Not on file  . Transportation needs:    Medical: Not on file    Non-medical: Not on file  Tobacco Use  . Smoking status: Never Smoker  Substance and Sexual Activity  . Alcohol use: Yes    Comment: occ  . Drug use: Never  . Sexual activity: Not on file  Lifestyle  . Physical activity:    Days per week: Not on file    Minutes per session: Not on file  . Stress: Not on file  Relationships  . Social connections:    Talks on phone: Not on file    Gets together: Not on file    Attends religious service: Not on file    Active member of club or organization: Not on file    Attends meetings of clubs or organizations: Not on file    Relationship status: Not on file  . Intimate partner violence:    Fear of current or ex partner: Not on file    Emotionally abused: Not on file    Physically abused: Not on file    Forced sexual activity:  Not on file  Other Topics Concern  . Not on file  Social History Narrative  . Not on file    FAMILY HISTORY: Family History  Problem Relation Age of Onset  . Prostate cancer Father   . Bone cancer Father   . Breast cancer Maternal Aunt     ALLERGIES:  has no allergies on file.  MEDICATIONS:  No current outpatient medications on file.   No current facility-administered medications for this visit.     REVIEW OF SYSTEMS:   Constitutional: Denies fevers, chills or abnormal night sweats Eyes: Denies blurriness of vision, double vision or watery eyes Ears, nose, mouth, throat, and face: Denies mucositis or sore throat Respiratory: Denies cough, dyspnea or wheezes Cardiovascular: Denies palpitation, chest discomfort or lower  extremity swelling Gastrointestinal:  Denies nausea, heartburn or change in bowel habits Skin: Denies abnormal skin rashes Lymphatics: Denies new lymphadenopathy or easy bruising Neurological:Denies numbness, tingling or new weaknesses Behavioral/Psych: Mood is stable, no new changes  Breast:  Denies any palpable lumps or discharge All other systems were reviewed with the patient and are negative.  PHYSICAL EXAMINATION: ECOG PERFORMANCE STATUS: 1 - Symptomatic but completely ambulatory  Vitals:   12/30/17 0853  BP: 127/75  Pulse: 74  Resp: 20  Temp: 97.9 F (36.6 C)  SpO2: 100%   Filed Weights   12/30/17 0853  Weight: 232 lb 3.2 oz (105.3 kg)    GENERAL:alert, no distress and comfortable SKIN: skin color, texture, turgor are normal, no rashes or significant lesions EYES: normal, conjunctiva are pink and non-injected, sclera clear OROPHARYNX:no exudate, no erythema and lips, buccal mucosa, and tongue normal  NECK: supple, thyroid normal size, non-tender, without nodularity LYMPH:  no palpable lymphadenopathy in the cervical, axillary or inguinal LUNGS: clear to auscultation and percussion with normal breathing effort HEART: regular rate & rhythm and no murmurs and no lower extremity edema ABDOMEN:abdomen soft, non-tender and normal bowel sounds Musculoskeletal:no cyanosis of digits and no clubbing  PSYCH: alert & oriented x 3 with fluent speech NEURO: no focal motor/sensory deficits BREAST: No palpable nodules in breast. No palpable axillary or supraclavicular lymphadenopathy (exam performed in the presence of a chaperone)   LABORATORY DATA:  I have reviewed the data as listed Lab Results  Component Value Date   WBC 5.3 12/30/2017   HGB 12.9 12/30/2017   HCT 40.8 12/30/2017   MCV 91.5 12/30/2017   PLT 231 12/30/2017   Lab Results  Component Value Date   NA 142 12/30/2017   K 3.7 12/30/2017   CL 106 12/30/2017   CO2 27 12/30/2017    RADIOGRAPHIC STUDIES: I  have personally reviewed the radiological reports and agreed with the findings in the report.  ASSESSMENT AND PLAN:  Malignant neoplasm of upper-inner quadrant of left breast in female, estrogen receptor positive ([place]) 12/18/2017:Screening mammogram detected left breast distortion at 11 o'clock position measured 8 mm by ultrasound.  Axilla negative.  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  T1bN0 stage Ia  Pathology and radiology counseling:Discussed with the patient, the details of pathology including the type of breast cancer,the clinical staging, the significance of ER, PR and HER-2/neu receptors and the implications for treatment. After reviewing the pathology in detail, we proceeded to discuss the different treatment options between surgery, radiation, chemotherapy, antiestrogen therapies.  Recommendations: 1. Breast conserving surgery with sentinel lymph node biopsy followed by 2. Oncotype DX testing to determine if  chemotherapy would be of any benefit followed by 3. Adjuvant radiation therapy followed by 4. Adjuvant antiestrogen therapy  Oncotype counseling: I discussed Oncotype DX test. I explained to the patient that this is a 21 gene panel to evaluate patient tumors DNA to calculate recurrence score. This would help determine whether patient has high risk or intermediate risk or low risk breast cancer. She understands that if her tumor was found to be high risk, she would benefit from systemic chemotherapy. If low risk, no need of chemotherapy. If she was found to be intermediate risk, we would need to evaluate the score as well as other risk factors and determine if an abbreviated chemotherapy may be of benefit.  Return to clinic after surgery to discuss final pathology report and then determine if Oncotype DX testing will need to be sent.     All questions were answered. The patient knows to call the clinic with any  problems, questions or concerns.    Harriette Ohara, MD 12/30/17

## 2017-12-30 NOTE — Telephone Encounter (Signed)
Per 10/16 los orders, [facility]Radiology will call patient with appts.

## 2018-01-05 ENCOUNTER — Telehealth: Payer: Self-pay | Admitting: *Deleted

## 2018-01-05 NOTE — Telephone Encounter (Signed)
  Oncology Nurse Navigator Documentation  Navigator Location: [facility] (01/05/18 1400)   )Navigator Encounter Type: Telephone;[facility] Follow-up (01/05/18 1400) Telephone: Outgoing Call;Clinic/[facility] Follow-up (01/05/18 1400)                                                  Time Spent with Patient: 15 (01/05/18 1400)

## 2018-01-07 ENCOUNTER — Other Ambulatory Visit: Payer: Self-pay | Admitting: General Surgery

## 2018-01-07 DIAGNOSIS — C50912 Malignant neoplasm of unspecified site of left female breast: Secondary | ICD-10-CM

## 2018-01-11 ENCOUNTER — Other Ambulatory Visit: Payer: Self-pay

## 2018-01-11 ENCOUNTER — Encounter (HOSPITAL_BASED_OUTPATIENT_CLINIC_OR_DEPARTMENT_OTHER): Payer: Self-pay | Admitting: *Deleted

## 2018-01-15 ENCOUNTER — Ambulatory Visit
Admission: RE | Admit: 2018-01-15 | Discharge: 2018-01-15 | Disposition: A | Discharge status: Home / Self Care | Payer: 59 | Source: Ambulatory Visit | Attending: General Surgery | Admitting: General Surgery

## 2018-01-15 DIAGNOSIS — C50212 Malignant neoplasm of upper-inner quadrant of left female breast: Secondary | ICD-10-CM | POA: Diagnosis not present

## 2018-01-15 DIAGNOSIS — C50912 Malignant neoplasm of unspecified site of left female breast: Secondary | ICD-10-CM

## 2018-01-15 NOTE — Progress Notes (Signed)
Ensure pre surgery drink given with instructions to complete by 0400 dos,surgical soap given with instructions,  pt verbalized understanding. 

## 2018-01-18 NOTE — H&P (Signed)
History of Present Illness Carly Kitchen T. Hoxworth MD; 12/30/2017 10:17 AM) The patient is a 57 year old female who presents with breast cancer. She is a postmenopausal female referred by Dr. Ammie Ferrier for evaluation of recently diagnosed carcinoma of the left breast. She recently presented for a screening mamogram revealing possible left breast distortion. Subsequent imaging included diagnostic mamogram showing a small persistent area of distortion in the upper inner left breast and ultrasound showing 0.8 x 0.7 cm hypoechoic area of distortion at the 11:00 position in the left breast. and left axillary ultrasound was negative. An ultrasound guided breast biopsy was performed on 12/18/2017 with pathology revealing invasive ductal carcinoma of the breast. She is seen now in breast multidisciplinary clinic for initial treatment planning. She has experienced no breast symptoms, specifically lump or nipple discharge, skin changes or pain. She does not have a personal history of any previous breast problems.  Findings at that time were the following: Tumor size: 0.8 cm Tumor grade: 2, Ki-67 12% Estrogen Receptor: 100% positive Progesterone Receptor: 90% positive Her-2 neu: negative Lymph node status: negative    Problem List/Past Medical Carly Kitchen T. 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 Hoxworth, MD; 12/30/2017 10:43 AM) Gallbladder Surgery - Laparoscopic   Diagnostic Studies History Carly Kitchen T. Hoxworth, MD; 12/30/2017 10:43 AM) Colonoscopy  never Pap Smear  1-5 years ago  Allergies Carly Kitchen T. Hoxworth, MD; 12/30/2017 10:43 AM) Codeine Sulfate *ANALGESICS - OPIOID*   Medication History Carly Kitchen T. Hoxworth, MD; 12/30/2017 10:43 AM) Medications Reconciled Atorvastatin Calcium (20MG Tablet, Oral) Active. Fluticasone Propionate (50MCG/ACT  Suspension, Nasal) Active. Montelukast Sodium (10MG Tablet, Oral) Active. Vitamin D (Ergocalciferol) (50000UNIT Capsule, Oral) Active.  Social History Carly Kitchen T. Hoxworth, MD; 12/30/2017 10:43 AM) Alcohol use  Occasional alcohol use. Caffeine use  Tea. No drug use  Tobacco use  Never smoker.  Family History Carly Kitchen T. Hoxworth, MD; 12/30/2017 10:43 AM) Arthritis  Father, Mother. Heart Disease  Father. Hypertension  Brother, Father, Mother. Prostate Cancer  Father.  Pregnancy / Birth History Carly Kitchen T. Hoxworth, MD; 12/30/2017 10:43 AM) Age at menarche  41 years. Age of menopause  67-50 Gravida  2 Maternal age  6-25 Para  2  Other Problems Carly Kitchen T. Hoxworth, MD; 12/30/2017 10:43 AM) Back Pain  Cholelithiasis  Gastric Ulcer  Gastroesophageal Reflux Disease  Hemorrhoids  High blood pressure  Hypercholesterolemia     Physical Exam Carly Kitchen T. Hoxworth MD; 12/30/2017 10:18 AM) The physical exam findings are as follows: Note:General: Alert, obese Caucasian female, in no distress Skin: Warm and dry without rash or infection. HEENT: No palpable masses or thyromegaly. Sclera nonicteric. Pupils equal round and reactive. Lymph nodes: No cervical, supraclavicular, nodes palpable. breasts: Slight bruising upper left breast. No palpable abnormalities in either breast. No skin changes or nipple discharge or crusting. Lungs: Breath sounds clear and equal. No wheezing or increased work of breathing. Cardiovascular: Regular rate and rhythm without murmer. No JVD or edema. Abdomen: Nondistended. Soft and nontender. No masses palpable. No organomegaly. Extremities: No edema or joint swelling or deformity. No chronic venous stasis changes. Neurologic: Alert and fully oriented. Gait normal. No focal weakness. Psychiatric: Normal mood and affect. Thought content appropriate with normal judgement and insight    Assessment & Plan Carly Kitchen T. Hoxworth MD;  12/30/2017 10:48 AM) MALIGNANT NEOPLASM OF LEFT BREAST, STAGE 1, ESTROGEN RECEPTOR POSITIVE (C50.912) Impression: 57 year old female with a new diagnosis of  cancer of the left breast, upper inner quadrant. Clinical stage 1, ER positive, PR positive, HER-2 negative. I discussed with the patient and family members present today initial surgical treatment options. We discussed options of breast conservation with lumpectomy or total mastectomy and sentinal lymph node biopsy/dissection. Options for reconstruction were discussed. After discussion they have elected to proceed with breast conservation with lumpectomy and axillary sentinel lymph node biopsy. We discussed the indications and nature of the procedure, and expected recovery, in detail. Surgical risks including anesthetic complications, cardiorespiratory complications, bleeding, infection, wound healing complications, blood clots, lymphedema, local and distant recurrence and possible need for further surgery based on the final pathology was discussed and understood. Chemotherapy, hormonal therapy and radiation therapy have been discussed. They have been provided with literature regarding the treatment of breast cancer. All questions were answered. They understand and agree to proceed and we will go ahead with scheduling. Current Plans radioactive seed localized left breast lumpectomy and left axillary sentinel lymph node biopsy under general anesthesia as an outpatient

## 2018-01-19 ENCOUNTER — Other Ambulatory Visit: Payer: Self-pay

## 2018-01-19 ENCOUNTER — Ambulatory Visit (HOSPITAL_COMMUNITY)
Admission: RE | Admit: 2018-01-19 | Discharge: 2018-01-19 | Disposition: A | Discharge status: Home / Self Care | Payer: 59 | Source: Ambulatory Visit | Attending: General Surgery | Admitting: General Surgery

## 2018-01-19 ENCOUNTER — Encounter (HOSPITAL_BASED_OUTPATIENT_CLINIC_OR_DEPARTMENT_OTHER)
Admission: RE | Disposition: A | Discharge status: Home / Self Care | Payer: Self-pay | Source: Ambulatory Visit | Attending: General Surgery

## 2018-01-19 ENCOUNTER — Ambulatory Visit (HOSPITAL_BASED_OUTPATIENT_CLINIC_OR_DEPARTMENT_OTHER)
Admission: RE | Admit: 2018-01-19 | Discharge: 2018-01-19 | Disposition: A | Discharge status: Home / Self Care | Payer: 59 | Source: Ambulatory Visit | Attending: General Surgery | Admitting: General Surgery

## 2018-01-19 ENCOUNTER — Encounter (HOSPITAL_BASED_OUTPATIENT_CLINIC_OR_DEPARTMENT_OTHER): Payer: Self-pay | Admitting: Certified Registered"

## 2018-01-19 ENCOUNTER — Ambulatory Visit
Admission: RE | Admit: 2018-01-19 | Discharge: 2018-01-19 | Disposition: A | Discharge status: Home / Self Care | Payer: 59 | Source: Ambulatory Visit | Attending: General Surgery | Admitting: General Surgery

## 2018-01-19 ENCOUNTER — Ambulatory Visit (HOSPITAL_BASED_OUTPATIENT_CLINIC_OR_DEPARTMENT_OTHER): Payer: 59 | Admitting: Anesthesiology

## 2018-01-19 DIAGNOSIS — Z8711 Personal history of peptic ulcer disease: Secondary | ICD-10-CM | POA: Diagnosis not present

## 2018-01-19 DIAGNOSIS — C50912 Malignant neoplasm of unspecified site of left female breast: Secondary | ICD-10-CM | POA: Diagnosis not present

## 2018-01-19 DIAGNOSIS — Z79899 Other long term (current) drug therapy: Secondary | ICD-10-CM | POA: Insufficient documentation

## 2018-01-19 DIAGNOSIS — Z9049 Acquired absence of other specified parts of digestive tract: Secondary | ICD-10-CM | POA: Insufficient documentation

## 2018-01-19 DIAGNOSIS — Z8261 Family history of arthritis: Secondary | ICD-10-CM | POA: Insufficient documentation

## 2018-01-19 DIAGNOSIS — Z8249 Family history of ischemic heart disease and other diseases of the circulatory system: Secondary | ICD-10-CM | POA: Insufficient documentation

## 2018-01-19 DIAGNOSIS — M1990 Unspecified osteoarthritis, unspecified site: Secondary | ICD-10-CM | POA: Diagnosis not present

## 2018-01-19 DIAGNOSIS — E7800 Pure hypercholesterolemia, unspecified: Secondary | ICD-10-CM | POA: Diagnosis not present

## 2018-01-19 DIAGNOSIS — Z170 Estrogen receptor positive status [ER+]: Secondary | ICD-10-CM | POA: Insufficient documentation

## 2018-01-19 DIAGNOSIS — C50212 Malignant neoplasm of upper-inner quadrant of left female breast: Secondary | ICD-10-CM | POA: Insufficient documentation

## 2018-01-19 DIAGNOSIS — Z885 Allergy status to narcotic agent status: Secondary | ICD-10-CM | POA: Insufficient documentation

## 2018-01-19 DIAGNOSIS — G8918 Other acute postprocedural pain: Secondary | ICD-10-CM | POA: Diagnosis not present

## 2018-01-19 DIAGNOSIS — M549 Dorsalgia, unspecified: Secondary | ICD-10-CM | POA: Insufficient documentation

## 2018-01-19 DIAGNOSIS — I10 Essential (primary) hypertension: Secondary | ICD-10-CM | POA: Insufficient documentation

## 2018-01-19 DIAGNOSIS — Z8042 Family history of malignant neoplasm of prostate: Secondary | ICD-10-CM | POA: Diagnosis not present

## 2018-01-19 DIAGNOSIS — K219 Gastro-esophageal reflux disease without esophagitis: Secondary | ICD-10-CM | POA: Diagnosis not present

## 2018-01-19 HISTORY — DX: Unspecified asthma, uncomplicated: J45.909

## 2018-01-19 HISTORY — DX: Malignant (primary) neoplasm, unspecified: C80.1

## 2018-01-19 HISTORY — PX: BREAST LUMPECTOMY WITH RADIOACTIVE SEED AND SENTINEL LYMPH NODE BIOPSY: SHX6550

## 2018-01-19 SURGERY — BREAST LUMPECTOMY WITH RADIOACTIVE SEED AND SENTINEL LYMPH NODE BIOPSY
Anesthesia: General | Site: Breast | Laterality: Left

## 2018-01-19 MED ORDER — DEXAMETHASONE SODIUM PHOSPHATE 10 MG/ML IJ SOLN
INTRAMUSCULAR | Status: AC
  Filled 2018-01-19: qty 1

## 2018-01-19 MED ORDER — PROMETHAZINE HCL 25 MG/ML IJ SOLN: 6.2500 mg | INTRAMUSCULAR | Status: DC | PRN

## 2018-01-19 MED ORDER — CHLORHEXIDINE GLUCONATE CLOTH 2 % EX PADS: 6.0000 | MEDICATED_PAD | Freq: Once | CUTANEOUS | Status: DC

## 2018-01-19 MED ORDER — FENTANYL CITRATE (PF) 100 MCG/2ML IJ SOLN
50.0000 ug | INTRAMUSCULAR | Status: DC | PRN
  Administered 2018-01-19: 50 ug via INTRAVENOUS

## 2018-01-19 MED ORDER — LACTATED RINGERS IV SOLN
INTRAVENOUS | Status: DC
  Administered 2018-01-19 (×2): via INTRAVENOUS

## 2018-01-19 MED ORDER — ONDANSETRON HCL 4 MG/2ML IJ SOLN
INTRAMUSCULAR | Status: AC
  Filled 2018-01-19: qty 4

## 2018-01-19 MED ORDER — ONDANSETRON HCL 4 MG/2ML IJ SOLN
INTRAMUSCULAR | Status: DC | PRN
  Administered 2018-01-19 (×2): 4 mg via INTRAVENOUS

## 2018-01-19 MED ORDER — FENTANYL CITRATE (PF) 100 MCG/2ML IJ SOLN
INTRAMUSCULAR | Status: AC
  Filled 2018-01-19: qty 2

## 2018-01-19 MED ORDER — PROPOFOL 10 MG/ML IV BOLUS
INTRAVENOUS | Status: DC | PRN
  Administered 2018-01-19: 30 mg via INTRAVENOUS
  Administered 2018-01-19: 200 mg via INTRAVENOUS

## 2018-01-19 MED ORDER — DEXAMETHASONE SODIUM PHOSPHATE 10 MG/ML IJ SOLN
INTRAMUSCULAR | Status: DC | PRN
  Administered 2018-01-19: 10 mg via INTRAVENOUS

## 2018-01-19 MED ORDER — ACETAMINOPHEN 500 MG PO TABS
1000.0000 mg | ORAL_TABLET | ORAL | Status: AC
  Administered 2018-01-19: 1000 mg via ORAL

## 2018-01-19 MED ORDER — OXYCODONE HCL 5 MG PO TABS: 5.0000 mg | ORAL_TABLET | Freq: Once | ORAL | Status: DC | PRN

## 2018-01-19 MED ORDER — BUPIVACAINE HCL (PF) 0.5 % IJ SOLN
INTRAMUSCULAR | Status: AC
  Filled 2018-01-19: qty 60

## 2018-01-19 MED ORDER — ACETAMINOPHEN 500 MG PO TABS
ORAL_TABLET | ORAL | Status: AC
  Filled 2018-01-19: qty 2

## 2018-01-19 MED ORDER — METHYLENE BLUE 0.5 % INJ SOLN
INTRAVENOUS | Status: AC
  Filled 2018-01-19: qty 10

## 2018-01-19 MED ORDER — GABAPENTIN 300 MG PO CAPS
300.0000 mg | ORAL_CAPSULE | ORAL | Status: AC
  Administered 2018-01-19: 300 mg via ORAL

## 2018-01-19 MED ORDER — GABAPENTIN 300 MG PO CAPS
ORAL_CAPSULE | ORAL | Status: AC
  Filled 2018-01-19: qty 1

## 2018-01-19 MED ORDER — PROPOFOL 10 MG/ML IV BOLUS
INTRAVENOUS | Status: AC
  Filled 2018-01-19: qty 20

## 2018-01-19 MED ORDER — CELECOXIB 200 MG PO CAPS
ORAL_CAPSULE | ORAL | Status: AC
  Filled 2018-01-19: qty 1

## 2018-01-19 MED ORDER — MIDAZOLAM HCL 2 MG/2ML IJ SOLN
1.0000 mg | INTRAMUSCULAR | Status: DC | PRN
  Administered 2018-01-19: 1 mg via INTRAVENOUS

## 2018-01-19 MED ORDER — PHENYLEPHRINE 40 MCG/ML (10ML) SYRINGE FOR IV PUSH (FOR BLOOD PRESSURE SUPPORT)
PREFILLED_SYRINGE | INTRAVENOUS | Status: DC | PRN
  Administered 2018-01-19 (×6): 40 ug via INTRAVENOUS

## 2018-01-19 MED ORDER — TECHNETIUM TC 99M SULFUR COLLOID FILTERED
1.0000 | Freq: Once | INTRAVENOUS | Status: AC | PRN
  Administered 2018-01-19: 1 via INTRADERMAL

## 2018-01-19 MED ORDER — BUPIVACAINE HCL (PF) 0.25 % IJ SOLN
INTRAMUSCULAR | Status: AC
  Filled 2018-01-19: qty 90

## 2018-01-19 MED ORDER — FENTANYL CITRATE (PF) 100 MCG/2ML IJ SOLN
INTRAMUSCULAR | Status: DC | PRN
  Administered 2018-01-19 (×2): 50 ug via INTRAVENOUS

## 2018-01-19 MED ORDER — BUPIVACAINE-EPINEPHRINE (PF) 0.25% -1:200000 IJ SOLN
INTRAMUSCULAR | Status: DC | PRN
  Administered 2018-01-19: 10 mL

## 2018-01-19 MED ORDER — OXYCODONE HCL 5 MG/5ML PO SOLN: 5.0000 mg | Freq: Once | ORAL | Status: DC | PRN

## 2018-01-19 MED ORDER — CEFAZOLIN SODIUM-DEXTROSE 2-4 GM/100ML-% IV SOLN
2.0000 g | INTRAVENOUS | Status: AC
  Administered 2018-01-19: 2 g via INTRAVENOUS

## 2018-01-19 MED ORDER — ACETAMINOPHEN 10 MG/ML IV SOLN: 1000.0000 mg | Freq: Once | INTRAVENOUS | Status: DC | PRN

## 2018-01-19 MED ORDER — FENTANYL CITRATE (PF) 100 MCG/2ML IJ SOLN
25.0000 ug | INTRAMUSCULAR | Status: DC | PRN
  Administered 2018-01-19: 50 ug via INTRAVENOUS

## 2018-01-19 MED ORDER — EPHEDRINE SULFATE-NACL 50-0.9 MG/10ML-% IV SOSY
PREFILLED_SYRINGE | INTRAVENOUS | Status: DC | PRN
  Administered 2018-01-19: 5 mg via INTRAVENOUS
  Administered 2018-01-19: 10 mg via INTRAVENOUS
  Administered 2018-01-19: 5 mg via INTRAVENOUS

## 2018-01-19 MED ORDER — LIDOCAINE 2% (20 MG/ML) 5 ML SYRINGE
INTRAMUSCULAR | Status: AC
  Filled 2018-01-19: qty 5

## 2018-01-19 MED ORDER — CELECOXIB 200 MG PO CAPS
200.0000 mg | ORAL_CAPSULE | ORAL | Status: AC
  Administered 2018-01-19: 200 mg via ORAL

## 2018-01-19 MED ORDER — PHENYLEPHRINE 40 MCG/ML (10ML) SYRINGE FOR IV PUSH (FOR BLOOD PRESSURE SUPPORT)
PREFILLED_SYRINGE | INTRAVENOUS | Status: AC
  Filled 2018-01-19: qty 10

## 2018-01-19 MED ORDER — LIDOCAINE 2% (20 MG/ML) 5 ML SYRINGE
INTRAMUSCULAR | Status: DC | PRN
  Administered 2018-01-19: 100 mg via INTRAVENOUS

## 2018-01-19 MED ORDER — BUPIVACAINE-EPINEPHRINE (PF) 0.25% -1:200000 IJ SOLN
INTRAMUSCULAR | Status: DC | PRN
  Administered 2018-01-19: 25 mL via PERINEURAL

## 2018-01-19 MED ORDER — SCOPOLAMINE 1 MG/3DAYS TD PT72: 1.0000 | MEDICATED_PATCH | Freq: Once | TRANSDERMAL | Status: DC | PRN

## 2018-01-19 MED ORDER — MIDAZOLAM HCL 2 MG/2ML IJ SOLN
INTRAMUSCULAR | Status: AC
  Filled 2018-01-19: qty 2

## 2018-01-19 MED ORDER — TRAMADOL HCL 50 MG PO TABS: 50.0000 mg | ORAL_TABLET | Freq: Four times a day (QID) | ORAL | 1 refills | Status: DC | PRN

## 2018-01-19 MED ORDER — CEFAZOLIN SODIUM-DEXTROSE 2-4 GM/100ML-% IV SOLN
INTRAVENOUS | Status: AC
  Filled 2018-01-19: qty 100

## 2018-01-19 MED ORDER — BUPIVACAINE-EPINEPHRINE (PF) 0.25% -1:200000 IJ SOLN
INTRAMUSCULAR | Status: AC
  Filled 2018-01-19: qty 60

## 2018-01-19 SURGICAL SUPPLY — 54 items
ADH SKN CLS APL DERMABOND .7 (GAUZE/BANDAGES/DRESSINGS) ×1
BINDER BREAST LRG (GAUZE/BANDAGES/DRESSINGS) IMPLANT
BINDER BREAST MEDIUM (GAUZE/BANDAGES/DRESSINGS) IMPLANT
BINDER BREAST XLRG (GAUZE/BANDAGES/DRESSINGS) IMPLANT
BINDER BREAST XXLRG (GAUZE/BANDAGES/DRESSINGS) IMPLANT
BLADE SURG 15 STRL LF DISP TIS (BLADE) ×1 IMPLANT
BLADE SURG 15 STRL SS (BLADE) ×2
CANISTER SUC SOCK COL 7IN (MISCELLANEOUS) IMPLANT
CANISTER SUCT 1200ML W/VALVE (MISCELLANEOUS) IMPLANT
CHLORAPREP W/TINT 26ML (MISCELLANEOUS) ×2 IMPLANT
CLIP VESOCCLUDE MED 6/CT (CLIP) IMPLANT
CLIP VESOCCLUDE SM WIDE 6/CT (CLIP) ×1 IMPLANT
COVER BACK TABLE 60X90IN (DRAPES) ×2 IMPLANT
COVER MAYO STAND STRL (DRAPES) ×2 IMPLANT
COVER PROBE W GEL 5X96 (DRAPES) ×2 IMPLANT
COVER WAND RF STERILE (DRAPES) IMPLANT
DECANTER SPIKE VIAL GLASS SM (MISCELLANEOUS) IMPLANT
DERMABOND ADVANCED (GAUZE/BANDAGES/DRESSINGS) ×1
DERMABOND ADVANCED .7 DNX12 (GAUZE/BANDAGES/DRESSINGS) ×1 IMPLANT
DEVICE DUBIN W/COMP PLATE 8390 (MISCELLANEOUS) ×2 IMPLANT
DRAPE LAPAROSCOPIC ABDOMINAL (DRAPES) ×2 IMPLANT
DRAPE UTILITY XL STRL (DRAPES) ×2 IMPLANT
ELECT COATED BLADE 2.86 ST (ELECTRODE) ×2 IMPLANT
ELECT REM PT RETURN 9FT ADLT (ELECTROSURGICAL) ×2
ELECTRODE REM PT RTRN 9FT ADLT (ELECTROSURGICAL) ×1 IMPLANT
GLOVE BIOGEL PI IND STRL 8 (GLOVE) ×1 IMPLANT
GLOVE BIOGEL PI INDICATOR 8 (GLOVE) ×2
GLOVE ECLIPSE 7.5 STRL STRAW (GLOVE) ×3 IMPLANT
GLOVE EXAM NITRILE EXT CUFF MD (GLOVE) ×1 IMPLANT
GLOVE SURG SYN 8.0 (GLOVE) ×4 IMPLANT
GLOVE SURG SYN 8.0 PF PI (GLOVE) IMPLANT
GOWN STRL REIN XL XLG (GOWN DISPOSABLE) ×1 IMPLANT
GOWN STRL REUS W/ TWL LRG LVL3 (GOWN DISPOSABLE) ×1 IMPLANT
GOWN STRL REUS W/ TWL XL LVL3 (GOWN DISPOSABLE) ×1 IMPLANT
GOWN STRL REUS W/TWL LRG LVL3 (GOWN DISPOSABLE)
GOWN STRL REUS W/TWL XL LVL3 (GOWN DISPOSABLE) ×2
ILLUMINATOR WAVEGUIDE N/F (MISCELLANEOUS) ×1 IMPLANT
KIT MARKER MARGIN INK (KITS) ×2 IMPLANT
NDL HYPO 25X1 1.5 SAFETY (NEEDLE) ×2 IMPLANT
NDL SAFETY ECLIPSE 18X1.5 (NEEDLE) ×1 IMPLANT
NEEDLE HYPO 18GX1.5 SHARP (NEEDLE)
NEEDLE HYPO 25X1 1.5 SAFETY (NEEDLE) ×2 IMPLANT
NS IRRIG 1000ML POUR BTL (IV SOLUTION) IMPLANT
PACK BASIN DAY SURGERY FS (CUSTOM PROCEDURE TRAY) ×2 IMPLANT
PENCIL BUTTON HOLSTER BLD 10FT (ELECTRODE) ×2 IMPLANT
SLEEVE SCD COMPRESS KNEE MED (MISCELLANEOUS) ×2 IMPLANT
SPONGE LAP 4X18 RFD (DISPOSABLE) ×2 IMPLANT
SUT MON AB 5-0 PS2 18 (SUTURE) ×2 IMPLANT
SUT VICRYL 3-0 CR8 SH (SUTURE) ×2 IMPLANT
SYR CONTROL 10ML LL (SYRINGE) ×3 IMPLANT
TOWEL GREEN STERILE FF (TOWEL DISPOSABLE) ×2 IMPLANT
TOWEL OR NON WOVEN STRL DISP B (DISPOSABLE) ×1 IMPLANT
TUBE CONNECTING 20X1/4 (TUBING) IMPLANT
YANKAUER SUCT BULB TIP NO VENT (SUCTIONS) IMPLANT

## 2018-01-19 NOTE — Discharge Instructions (Signed)
[facility] °Office Phone Number [phone] ° °BREAST BIOPSY/ LUMPECTOMY: POST OP INSTRUCTIONS ° °Always review your discharge instruction sheet given to you by the facility where your surgery was performed. ° °IF YOU HAVE DISABILITY OR FAMILY LEAVE FORMS, YOU MUST BRING THEM TO THE OFFICE FOR PROCESSING.  DO NOT GIVE THEM TO YOUR DOCTOR. ° °1. A prescription for pain medication may be given to you upon discharge.  Take your pain medication as prescribed, if needed.  If narcotic pain medicine is not needed, then you may take acetaminophen (Tylenol) or ibuprofen (Advil) as needed. °2. Take your usually prescribed medications unless otherwise directed °3. If you need a refill on your pain medication, please contact your pharmacy.  They will contact our office to request authorization.  Prescriptions will not be filled after 5pm or on week-ends. °4. You should eat very light the first 24 hours after surgery, such as soup, crackers, pudding, etc.  Resume your normal diet the day after surgery. °5. Most patients will experience some swelling and bruising in the breast.  Ice packs and a good support bra will help.  Swelling and bruising can take several days to resolve.  °6. It is common to experience some constipation if taking pain medication after surgery.  Increasing fluid intake and taking a stool softener will usually help or prevent this problem from occurring.  A mild laxative (Milk of Magnesia or Miralax) should be taken according to package directions if there are no bowel movements after 48 hours. °7. Unless discharge instructions indicate otherwise, you may remove your bandages 24-48 hours after surgery, and you may shower at that time.  You may have steri-strips (small skin tapes) in place directly over the incision.  These strips should be left on the skin for 7-10 days.  If your surgeon used skin glue on the incision, you may shower in 24 hours.  The glue will flake off over the next 2-3  weeks.  Any sutures or staples will be removed at the office during your follow-up visit. °8. ACTIVITIES:  You may resume regular daily activities (gradually increasing) beginning the next day.  Wearing a good support bra or sports bra minimizes pain and swelling.  You may have sexual intercourse when it is comfortable. °a. You may drive when you no longer are taking prescription pain medication, you can comfortably wear a seatbelt, and you can safely maneuver your car and apply brakes. °b. RETURN TO WORK:  ______________________________________________________________________________________ °9. You should see your doctor in the office for a follow-up appointment approximately two weeks after your surgery.  Your doctor’s nurse will typically make your follow-up appointment when she calls you with your pathology report.  Expect your pathology report 2-3 business days after your surgery.  You may call to check if you do not hear from us after three days. °10. OTHER INSTRUCTIONS: _______________________________________________________________________________________________ _____________________________________________________________________________________________________________________________________ °_____________________________________________________________________________________________________________________________________ °_____________________________________________________________________________________________________________________________________ ° °WHEN TO CALL YOUR DOCTOR: °1. Fever over 101.0 °2. Nausea and/or vomiting. °3. Extreme swelling or bruising. °4. Continued bleeding from incision. °5. Increased pain, redness, or drainage from the incision. ° °The clinic staff is available to answer your questions during regular business hours.  Please don’t hesitate to call and ask to speak to one of the nurses for clinical concerns.  If you have a medical emergency, go to the nearest emergency  room or call 911.  A surgeon from [facility] is always on call at the hospital. ° °For further questions, please visit [email or web address]  ° °  Post Anesthesia Home Care Instructions ° °Activity: °Get plenty of rest for the remainder of the day. A responsible individual must stay with you for 24 hours following the procedure.  °For the next 24 hours, DO NOT: °-Drive a car °-Operate machinery °-Drink alcoholic beverages °-Take any medication unless instructed by your physician °-Make any legal decisions or sign important papers. ° °Meals: °Start with liquid foods such as gelatin or soup. Progress to regular foods as tolerated. Avoid greasy, spicy, heavy foods. If nausea and/or vomiting occur, drink only clear liquids until the nausea and/or vomiting subsides. Call your physician if vomiting continues. ° °Special Instructions/Symptoms: °Your throat may feel dry or sore from the anesthesia or the breathing tube placed in your throat during surgery. If this causes discomfort, gargle with warm salt water. The discomfort should disappear within 24 hours. ° °If you had a scopolamine patch placed behind your ear for the management of post- operative nausea and/or vomiting: ° °1. The medication in the patch is effective for 72 hours, after which it should be removed.  Wrap patch in a tissue and discard in the trash. Wash hands thoroughly with soap and water. °2. You may remove the patch earlier than 72 hours if you experience unpleasant side effects which may include dry mouth, dizziness or visual disturbances. °3. Avoid touching the patch. Wash your hands with soap and water after contact with the patch. °  ° ° °

## 2018-01-19 NOTE — Anesthesia Procedure Notes (Addendum)
Anesthesia Regional Block: Pectoralis block   Pre-Anesthetic Checklist: ,, timeout performed, Correct Patient, Correct Site, Correct Laterality, Correct Procedure, Correct Position, site marked, Risks and benefits discussed, pre-op evaluation,  At surgeon's request and post-op pain management  Laterality: Left  Prep: Maximum Sterile Barrier Precautions used, chloraprep       Needles:  Injection technique: Single-shot  Needle Type: Echogenic Stimulator Needle     Needle Length: 4cm  Needle Gauge: 22     Additional Needles:   Procedures:,,,, ultrasound used (permanent image in chart),,,,  Narrative:  Start time: 01/19/2018 7:08 AM End time: 01/19/2018 7:11 AM Injection made incrementally with aspirations every 5 mL.  Performed by: Personally  Anesthesiologist: Brennan Bailey, MD  Additional Notes: Risks, benefits, and alternative discussed. Patient gave consent for procedure. Patient prepped and draped in sterile fashion. Sedation administered, patient remains easily responsive to voice. Relevant anatomy identified with ultrasound guidance. Local anesthetic given in 5cc increments with no signs or symptoms of intravascular injection. No pain or paraesthesias with injection. Patient monitored throughout procedure with signs of LAST or immediate complications. Tolerated well. Ultrasound image placed in chart.  Tawny Asal, MD

## 2018-01-19 NOTE — Anesthesia Postprocedure Evaluation (Signed)
Anesthesia Post Note  Patient: Carly Farmer  Procedure(s) Performed: BREAST LUMPECTOMY WITH RADIOACTIVE SEED AND SENTINEL LYMPH NODE BIOPSY (Left Breast)     Patient location during evaluation: PACU Anesthesia Type: General Level of consciousness: awake and alert Pain management: pain level controlled Vital Signs Assessment: post-procedure vital signs reviewed and stable Respiratory status: spontaneous breathing, nonlabored ventilation and respiratory function stable Cardiovascular status: blood pressure returned to baseline and stable Postop Assessment: no apparent nausea or vomiting Anesthetic complications: no    Last Vitals:  Vitals:   01/19/18 0858 01/19/18 0900  BP: 120/79 120/79  Pulse: 88 87  Resp: 15 16  Temp: 36.5 C   SpO2: 100% 97%    Last Pain:  Vitals:   01/19/18 0900  TempSrc:   PainSc: 0-No pain                 Brennan Bailey

## 2018-01-19 NOTE — Anesthesia Procedure Notes (Signed)
Procedure Name: LMA Insertion Date/Time: 01/19/2018 7:41 AM Performed by: Gwyndolyn Saxon, CRNA Pre-anesthesia Checklist: Patient identified, Emergency Drugs available, Suction available and Patient being monitored Patient Re-evaluated:Patient Re-evaluated prior to induction Oxygen Delivery Method: Circle system utilized Preoxygenation: Pre-oxygenation with 100% oxygen Induction Type: IV induction Ventilation: Mask ventilation without difficulty LMA: LMA inserted LMA Size: 4.0 Number of attempts: 1 Placement Confirmation: positive ETCO2 and breath sounds checked- equal and bilateral Tube secured with: Tape Dental Injury: Teeth and Oropharynx as per pre-operative assessment

## 2018-01-19 NOTE — Op Note (Signed)
Preoperative Diagnosis: LEFT BREAST CANCER  Postoprative Diagnosis: LEFT BREAST CANCER  Procedure: Procedure(s): BREAST LUMPECTOMY WITH RADIOACTIVE SEED AND SENTINEL LYMPH NODE BIOPSY   Surgeon: Excell Seltzer T   Assistants: None  Anesthesia:  General LMA anesthesia  Indications: 57 year old female with a new diagnosis of cancer of the left breast, upper inner quadrant. Clinical stage 1, ER positive, PR positive, HER-2 negative.  After work-up and discussion detailed elsewhere we have elected to proceed with radioactive seed guided left breast lumpectomy and axillary sentinel lymph node biopsy as initial surgical therapy.    Procedure Detail: Preoperatively the patient had undergone accurate placement of a radioactive seed at the tumor and clip site in the upper slightly inner left breast.  Seed placement was confirmed in the holding area.  Preoperatively she underwent injection of 1 mCi of technetium sulfur colloid intradermally around the left nipple and underwent a pectoral block by anesthesia.  She was taken to the operating room, placed in supine position on the operating table, and laryngeal mask general anesthesia induced.  The arm was carefully positioned extended and the entire left breast and axilla were widely sterilely prepped and draped.  She received preoperative IV antibiotics.  Patient timeout was performed and correct procedure verified. The lumpectomy was approached initially.  The seed was localized in the fairly extreme upper slightly inner left breast.  I used a circumareolar incision in the upper inner breast and a skin and subtenons flap was developed with a lighter retractor superiorly and slightly medially overlying the seed confirmed with the neoprobe.  Using the neoprobe for guidance I excised an approximately 2 cm globular specimen around the seed.  Specimen x-ray showed the seed and marking clip centrally located within the specimen although examination with the  neoprobe and somewhat an x-ray showed the clip slightly toward the superior lateral and posterior margin.  I reexcised the superior lateral margin for an additional half centimeter which was also oriented with ink and sent as a separate specimen.  Hemostasis was assured and the lumpectomy cavity marked with clips.  The deep breast and subtenons tissue was closed with interrupted 3-0 Vicryl. Attention was turned to the axillary sentinel lymph node.  A definite hot area in the left axilla was localized and a small transverse incision made.  Dissection was carried down through subtendinous tissue and clavipectoral fascia.  Using the neoprobe for guidance I bluntly dissected down onto a somewhat enlarged but soft lymph node with very high counts.  This was completely excised with cautery and ex vivo had counts of about 900.  I carefully examined the rest of the axilla and there were no palpable nodes.  There was one area of counts of about 180 but as I dissected down this was in the pectoralis muscle and I could not find any other nodes with significantly elevated counts.  This was sent as hot left axillary sentinel lymph node.  Hemostasis was assured in the deep axillary and subtenons tissue closed with interrupted 3-0 Vicryl.  The incisions were infiltrated with Marcaine and were closed with running some particular 5-0 Monocryl and Dermabond.  Sponge needle and instrument counts were correct.    Findings: As above  Estimated Blood Loss:  Minimal         Drains: None  Blood Given: none          Specimens: #1 left breast lumpectomy   #2 further superior medial margin   #3 left axillary sentinel lymph node  Complications:  * No complications entered in OR log *         Disposition: PACU - hemodynamically stable.         Condition: stable

## 2018-01-19 NOTE — Interval H&P Note (Signed)
History and Physical Interval Note:  01/19/2018 7:12 AM  Carly Farmer  has presented today for surgery, with the diagnosis of LEFT BREAST CANCER  The various methods of treatment have been discussed with the patient and family. After consideration of risks, benefits and other options for treatment, the patient has consented to  Procedure(s): BREAST LUMPECTOMY WITH RADIOACTIVE SEED AND SENTINEL LYMPH NODE BIOPSY (Left) as a surgical intervention .  The patient's history has been reviewed, patient examined, no change in status, stable for surgery.  I have reviewed the patient's chart and labs.  Questions were answered to the patient's satisfaction.     Carly Farmer

## 2018-01-19 NOTE — Transfer of Care (Signed)
Immediate Anesthesia Transfer of Care Note  Patient: Carly Farmer  Procedure(s) Performed: BREAST LUMPECTOMY WITH RADIOACTIVE SEED AND SENTINEL LYMPH NODE BIOPSY (Left Breast)  Patient Location: PACU  Anesthesia Type:General and Regional  Level of Consciousness: drowsy and patient cooperative  Airway & Oxygen Therapy: Patient Spontanous Breathing and Patient connected to face mask oxygen  Post-op Assessment: Report given to RN and Post -op Vital signs reviewed and stable  Post vital signs: Reviewed and stable  Last Vitals:  Vitals Value Taken Time  BP    Temp    Pulse    Resp    SpO2      Last Pain:  Vitals:   01/19/18 0653  TempSrc: Oral  PainSc: 0-No pain         Complications: No apparent anesthesia complications

## 2018-01-19 NOTE — Anesthesia Preprocedure Evaluation (Addendum)
Anesthesia Evaluation  Patient identified by MRN, date of birth, ID band Patient awake    Reviewed: Allergy & Precautions, NPO status , Patient's Chart, lab work & pertinent test results  History of Anesthesia Complications Negative for: history of anesthetic complications  Airway Mallampati: II  TM Distance: >3 FB Neck ROM: Full    Dental no notable dental hx. (+) Teeth Intact, Dental Advisory Given   Pulmonary neg pulmonary ROS, asthma ,    Pulmonary exam normal breath sounds clear to auscultation       Cardiovascular negative cardio ROS Normal cardiovascular exam Rhythm:Regular Rate:Normal     Neuro/Psych Anxiety Depression negative neurological ROS  negative psych ROS   GI/Hepatic negative GI ROS, Neg liver ROS,   Endo/Other  negative endocrine ROS  Renal/GU negative Renal ROS  negative genitourinary   Musculoskeletal negative musculoskeletal ROS (+) Arthritis , Osteoarthritis,    Abdominal   Peds negative pediatric ROS (+)  Hematology negative hematology ROS (+)   Anesthesia Other Findings Day of surgery medications reviewed with the patient.  Reproductive/Obstetrics negative OB ROS Left breast cancer                            Anesthesia Physical Anesthesia Plan  ASA: II  Anesthesia Plan: General   Post-op Pain Management:    Induction: Intravenous  PONV Risk Score and Plan: 3 and Treatment may vary due to age or medical condition, Ondansetron, Dexamethasone and Midazolam  Airway Management Planned: LMA  Additional Equipment:   Intra-op Plan:   Post-operative Plan: Extubation in OR  Informed Consent: I have reviewed the patients History and Physical, chart, labs and discussed the procedure including the risks, benefits and alternatives for the proposed anesthesia with the patient or authorized representative who has indicated his/her understanding and acceptance.    Dental advisory given  Plan Discussed with: CRNA  Anesthesia Plan Comments:       Anesthesia Quick Evaluation

## 2018-01-19 NOTE — Progress Notes (Signed)
Assisted Dr. Daiva Huge with left, ultrasound guided, pectoralis block. Side rails up, monitors on throughout procedure. See vital signs in flow sheet. Tolerated Procedure well.

## 2018-01-20 ENCOUNTER — Encounter (HOSPITAL_BASED_OUTPATIENT_CLINIC_OR_DEPARTMENT_OTHER): Payer: Self-pay | Admitting: General Surgery

## 2018-01-22 ENCOUNTER — Telehealth: Payer: Self-pay | Admitting: *Deleted

## 2018-01-22 NOTE — Telephone Encounter (Signed)
Received order for oncotype testing. Requisition faxed to pathology. Received by Keisha 

## 2018-01-27 ENCOUNTER — Inpatient Hospital Stay: Payer: 59 | Attending: Hematology and Oncology | Admitting: Hematology and Oncology

## 2018-01-27 ENCOUNTER — Telehealth: Payer: Self-pay | Admitting: Hematology and Oncology

## 2018-01-27 DIAGNOSIS — Z170 Estrogen receptor positive status [ER+]: Secondary | ICD-10-CM

## 2018-01-27 DIAGNOSIS — Z79899 Other long term (current) drug therapy: Secondary | ICD-10-CM

## 2018-01-27 DIAGNOSIS — C50212 Malignant neoplasm of upper-inner quadrant of left female breast: Secondary | ICD-10-CM | POA: Diagnosis present

## 2018-01-27 NOTE — Telephone Encounter (Signed)
No 11/13 los or referrals

## 2018-01-27 NOTE — Assessment & Plan Note (Signed)
01/19/2018:Left lumpectomy: IDC with DCIS, 1.2 cm, grade 2, margins negative, focal lymphovascular space invasion by tumor, 0/1 lymph node negative, additional superomedial margin benign, ER 100%, PR 90%, HER-2 negative, Ki-67 12%, T1CN0 stage Ia  Pathology counseling: I discussed the final pathology report of the patient provided  a copy of this report. I discussed the margins as well as lymph node surgeries. We also discussed the final staging along with previously performed ER/PR and HER-2/neu testing.  Recommendation: 1. Oncotype DX testing to determine if chemotherapy would be of any benefit followed by 2. Adjuvant radiation therapy followed by 3. Adjuvant antiestrogen therapy  Return to clinic based upon Oncotype DX test result

## 2018-01-27 NOTE — Progress Notes (Signed)
Patient Care Team: Jaclynn Major, NP as PCP - General (Nurse Practitioner) Excell Seltzer, MD as Consulting Physician (General Surgery) Nicholas Lose, MD as Consulting Physician (Hematology and Oncology) Gery Pray, MD as Consulting Physician (Radiation Oncology)  DIAGNOSIS:  Encounter Diagnosis  Name Primary?  . Malignant neoplasm of upper-inner quadrant of left breast in female, estrogen receptor positive ([place])     SUMMARY OF ONCOLOGIC HISTORY:   Malignant neoplasm of upper-inner quadrant of left breast in female, estrogen receptor positive ([place])   12/18/2017 Initial Diagnosis    Screening mammogram detected left breast distortion at 11 o'clock position measured 8 mm by ultrasound.  Axilla negative.  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  T1bN0 stage Ia    01/19/2018 Surgery    Left lumpectomy: IDC with DCIS, 1.2 cm, grade 2, margins negative, focal lymphovascular space invasion by tumor, 0/1 lymph node negative, additional superomedial margin benign, ER 100%, PR 90%, HER-2 negative, Ki-67 12%, T1CN0 stage Ia     CHIEF COMPLIANT: Follow-up after recent left lumpectomy  INTERVAL HISTORY: Carly Farmer is a 57 year old with above-mentioned history left breast cancer underwent lumpectomy and is here today to discuss the final pathology report.  She is healing and recovering very well from recent surgery.  She is very concerned about her return to work.  She lifts very heavy objects at work measuring 50 to 70 pounds.  She is currently off of work until December 12.  Her work is putting a lot of pressure on her to tell them when she is coming back for full-time work.  Unfortunately we do not know whether she needs chemo and therefore we are holding back on giving them a final word.  REVIEW OF SYSTEMS:   Constitutional: Denies fevers, chills or abnormal weight loss Eyes: Denies blurriness of vision Ears,  nose, mouth, throat, and face: Denies mucositis or sore throat Respiratory: Denies cough, dyspnea or wheezes Cardiovascular: Denies palpitation, chest discomfort Gastrointestinal:  Denies nausea, heartburn or change in bowel habits Skin: Denies abnormal skin rashes Lymphatics: Denies new lymphadenopathy or easy bruising Neurological:Denies numbness, tingling or new weaknesses Behavioral/Psych: Mood is stable, no new changes  Extremities: No lower extremity edema Breast: Recent left lumpectomy All other systems were reviewed with the patient and are negative.  I have reviewed the past medical history, past surgical history, social history and family history with the patient and they are unchanged from previous note.  ALLERGIES:  is allergic to codeine.  MEDICATIONS:  Current Outpatient Medications  Medication Sig Dispense Refill  . phentermine 37.5 MG capsule Take 37.5 mg by mouth every morning.    . sertraline (ZOLOFT) 25 MG tablet Take 25 mg by mouth daily.    . traMADol (ULTRAM) 50 MG tablet Take 1 tablet (50 mg total) by mouth every 6 (six) hours as needed. 10 tablet 1   No current facility-administered medications for this visit.     PHYSICAL EXAMINATION: ECOG PERFORMANCE STATUS: 1 - Symptomatic but completely ambulatory  Vitals:   01/27/18 0843  BP: (!) 144/91  Pulse: 72  Resp: 18  Temp: 97.6 F (36.4 C)  SpO2: 100%   Filed Weights   01/27/18 0843  Weight: 227 lb 14.4 oz (103.4 kg)    GENERAL:alert, no distress and comfortable SKIN: skin color, texture, turgor are normal, no rashes or significant lesions EYES: normal, Conjunctiva are pink and non-injected, sclera clear OROPHARYNX:no exudate, no erythema  and lips, buccal mucosa, and tongue normal  NECK: supple, thyroid normal size, non-tender, without nodularity LYMPH:  no palpable lymphadenopathy in the cervical, axillary or inguinal LUNGS: clear to auscultation and percussion with normal breathing effort HEART:  regular rate & rhythm and no murmurs and no lower extremity edema ABDOMEN:abdomen soft, non-tender and normal bowel sounds MUSCULOSKELETAL:no cyanosis of digits and no clubbing  NEURO: alert & oriented x 3 with fluent speech, no focal motor/sensory deficits EXTREMITIES: No lower extremity edema   LABORATORY DATA:  I have reviewed the data as listed CMP Latest Ref Rng & Units 12/30/2017  Glucose 70 - 99 mg/dL 110(H)  BUN 6 - 20 mg/dL 12  Creatinine 0.44 - 1.00 mg/dL 1.07(H)  Sodium 135 - 145 mmol/L 142  Potassium 3.5 - 5.1 mmol/L 3.7  Chloride 98 - 111 mmol/L 106  CO2 22 - 32 mmol/L 27  Calcium 8.9 - 10.3 mg/dL 9.2  Total Protein 6.5 - 8.1 g/dL 6.5  Total Bilirubin 0.3 - 1.2 mg/dL 0.5  Alkaline Phos 38 - 126 U/L 75  AST 15 - 41 U/L 12(L)  ALT 0 - 44 U/L 14    Lab Results  Component Value Date   WBC 5.3 12/30/2017   HGB 12.9 12/30/2017   HCT 40.8 12/30/2017   MCV 91.5 12/30/2017   PLT 231 12/30/2017   NEUTROABS 2.8 12/30/2017    ASSESSMENT & PLAN:  Malignant neoplasm of upper-inner quadrant of left breast in female, estrogen receptor positive ([facility]) 01/19/2018:Left lumpectomy: IDC with DCIS, 1.2 cm, grade 2, margins negative, focal lymphovascular space invasion by tumor, 0/1 lymph node negative, additional superomedial margin benign, ER 100%, PR 90%, HER-2 negative, Ki-67 12%, T1CN0 stage Ia  Pathology counseling: I discussed the final pathology report of the patient provided  a copy of this report. I discussed the margins as well as lymph node surgeries. We also discussed the final staging along with previously performed ER/PR and HER-2/neu testing.  Recommendation: 1. Oncotype DX testing to determine if chemotherapy would be of any benefit followed by 2. Adjuvant radiation therapy followed by 3. Adjuvant antiestrogen therapy  Return to clinic based upon Oncotype DX test result Her work is pressuring her to indicate when she will come back to work.  She lifts 50 to 70 pound  weights.  I do not think she should be lifting those weights during radiation.  We will send additional paperwork depending upon Oncotype DX test results.  We will need to know whether or not she needs chemotherapy before deciding on her return to work.  No orders of the defined types were placed in this encounter.  The patient has a good understanding of the overall plan. she agrees with it. she will call with any problems that may develop before the next visit here.   Harriette Ohara, MD 01/27/18

## 2018-02-01 ENCOUNTER — Encounter: Payer: Self-pay | Admitting: Physical Therapy

## 2018-02-01 ENCOUNTER — Ambulatory Visit: Payer: 59 | Attending: General Surgery | Admitting: Physical Therapy

## 2018-02-01 ENCOUNTER — Other Ambulatory Visit: Payer: Self-pay

## 2018-02-01 DIAGNOSIS — Z483 Aftercare following surgery for neoplasm: Secondary | ICD-10-CM | POA: Insufficient documentation

## 2018-02-01 DIAGNOSIS — C50212 Malignant neoplasm of upper-inner quadrant of left female breast: Secondary | ICD-10-CM | POA: Diagnosis not present

## 2018-02-01 DIAGNOSIS — Z170 Estrogen receptor positive status [ER+]: Secondary | ICD-10-CM | POA: Insufficient documentation

## 2018-02-01 DIAGNOSIS — R293 Abnormal posture: Secondary | ICD-10-CM | POA: Diagnosis present

## 2018-02-01 DIAGNOSIS — C50912 Malignant neoplasm of unspecified site of left female breast: Secondary | ICD-10-CM | POA: Diagnosis not present

## 2018-02-01 NOTE — Therapy (Signed)
[facility] [place], [place], [address] Phone: [phone]   Fax:  [phone]  Physical Therapy Treatment  Patient Details  Name: Carly Farmer MRN: [id number] Date of Birth: 04/01/1960 Referring Provider (PT): Dr. Excell Seltzer   Encounter Date: 02/01/2018  PT End of Session - 02/01/18 1429    Visit Number  2    Number of Visits  2    PT Start Time  8366    PT Stop Time  1438    PT Time Calculation (min)  43 min    Activity Tolerance  Patient tolerated treatment well    Behavior During Therapy  [facility] for tasks assessed/performed       Past Medical History:  Diagnosis Date  . Anxiety   . Arthritis    hips and lt knee  . Asthma    adult onset  . Cancer ([facility]) 12/2017   lef breast cancer  . Depression   . High cholesterol     Past Surgical History:  Procedure Laterality Date  . BREAST LUMPECTOMY WITH RADIOACTIVE SEED AND SENTINEL LYMPH NODE BIOPSY Left 01/19/2018   Procedure: BREAST LUMPECTOMY WITH RADIOACTIVE SEED AND SENTINEL LYMPH NODE BIOPSY;  Surgeon: Excell Seltzer, MD;  Location: [facility];  Service: General;  Laterality: Left;  . CHOLECYSTECTOMY    . TUBAL LIGATION    . wisdom teeth removal      There were no vitals filed for this visit.  Subjective Assessment - 02/01/18 1401    Subjective  Patient underwent a left lumpectomy and sentinel node biopsy (0/1 nodes positive) on 01/19/18. She had an Oncotype test ordered but results are not back yet. She will also undergo radiation and anti-estrogen therapy.    Pertinent History  Patient was diagnosed on 12/09/17 with left grade II invasive ductal carcinoma breast cancer. Patient underwent a left lumpectomy and sentinel node biopsy (0/1 nodes positive) on 01/19/18. It is ER/PR positive and HER2 negative with a Ki67 of 12%. She has no other known medical problems.    Patient Stated Goals  Make sure my arm is ok    Currently in  Pain?  Yes    Pain Score  4     Pain Location  Axilla    Pain Orientation  Left    Pain Descriptors / Indicators  Aching    Pain Type  Surgical pain    Pain Onset  1 to 4 weeks ago    Pain Frequency  Intermittent    Aggravating Factors   Lifitng; sleeping on the left side    Pain Relieving Factors  Rest         [facility] [employer] PT Assessment - 02/01/18 0001      Assessment   Medical Diagnosis  s/p left lumpectomy with SLNB    Referring Provider (PT)  Dr. Excell Seltzer    Onset Date/Surgical Date  01/19/18    Hand Dominance  Right    Prior Therapy  Baseline assessment      Precautions   Precautions  Other (comment)    Precaution Comments  Recent surgery      Restrictions   Weight Bearing Restrictions  No      Balance Screen   Has the patient fallen in the past 6 months  No    Has the patient had a decrease in activity level because of a fear of falling?   No    Is the patient reluctant to leave their  home because of a fear of falling?   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 Compression foam issued and instructions for wearing a sports bra were given verbally      Posture/Postural Control   Posture/Postural Control  Postural limitations    Postural Limitations  Rounded Shoulders;Forward head      ROM / Strength   AROM / PROM / Strength   AROM      AROM   AROM Assessment Site  Shoulder    Right/Left Shoulder  Right    Left Shoulder Extension  60 Degrees    Left Shoulder Flexion  153 Degrees    Left Shoulder ABduction  178 Degrees    Left Shoulder Internal Rotation  64 Degrees    Left Shoulder External Rotation  85 Degrees      Strength   Overall Strength  Within functional limits for tasks performed   Pain with resisted left shoulder IR/ER and elbow flexion/ext       LYMPHEDEMA/ONCOLOGY QUESTIONNAIRE - 02/01/18 1409      Type   Cancer Type  Left breast cancer      Surgeries   Lumpectomy Date  01/19/18    Sentinel Lymph Node Biopsy Date  01/19/18    Number Lymph Nodes Removed  1      Treatment   Active Chemotherapy Treatment  No    Past Chemotherapy Treatment  No    Active Radiation Treatment  No    Past Radiation Treatment  No    Current Hormone Treatment  No    Past Hormone Therapy  No      What other symptoms do you have   Are you Having Heaviness or Tightness  Yes    Are you having Pain  Yes    Are you having pitting edema  No    Is it Hard or Difficult finding clothes that fit  No    Do you have infections  No    Is there Decreased scar mobility  No    Stemmer Sign  No      Lymphedema Assessments   Lymphedema Assessments  Upper extremities      Right Upper Extremity Lymphedema   10 cm Proximal to Olecranon Process  31.5 cm    Olecranon Process  26.9 cm    10 cm Proximal to Ulnar Styloid Process  26.2 cm    Just Proximal to Ulnar Styloid Process  17.5 cm    Across Hand at [employer]  20.2 cm    At [place] of 2nd Digit  6.5 cm      Left Upper Extremity Lymphedema   10 cm Proximal to Olecranon Process  33.3 cm    Olecranon  Process  26.3 cm    10 cm Proximal to Ulnar Styloid Process  25.2 cm    Just Proximal to Ulnar Styloid Process  17.9 cm    Across Hand at [employer]  19.8 cm    At [place] of 2nd Digit  6.2 cm        Katina Dung - 02/01/18 0001    Open a tight or new jar  Mild  difficulty    Do heavy household chores (wash walls, wash floors)  No difficulty    Carry a shopping bag or briefcase  Moderate difficulty    Wash your back  Mild difficulty    Use a knife to cut food  No difficulty    Recreational activities in which you take some force or impact through your arm, shoulder, or hand (golf, hammering, tennis)  Moderate difficulty    During the past week, to what extent has your arm, shoulder or hand problem interfered with your normal social activities with family, friends, neighbors, or groups?  Slightly    During the past week, to what extent has your arm, shoulder or hand problem limited your work or other regular daily activities  Slightly    Arm, shoulder, or hand pain.  Mild    Tingling (pins and needles) in your arm, shoulder, or hand  Mild    Difficulty Sleeping  Moderate difficulty    DASH Score  27.27 %                     PT Education - 02/01/18 1428    Education Details  Use of compression bra for breast edema and compression sleeve for flying.    Person(s) Educated  Patient    Methods  Explanation;Handout    Comprehension  Verbalized understanding          PT Long Term Goals - 02/01/18 1449      PT LONG TERM GOAL #1   Title  Patient will demonstrate she ha regained full shoulder ROM and function post operatively compared to baseline measurements.    Time  8    Period  Weeks    Status  Achieved            Plan - 02/01/18 1443    Clinical Impression Statement  Patient is doing very well s/p left lumpectomy and SLNB. She appears to have mild edema present in the upper outer quadrant of her left breast from her SLNB but her bra appears to be contributing as it has no compression and ends just below where her edema is. She went to [employer] [place] after her PT visit today to get a comrpession bra. She has concerns about returning to work in 2 weeks as her job requires very heavy repeated lifting and she has some pain  with resisted left shoulder muscle testing today in her left chest. She was encouraged to talk with her surgeon tomorrow about work as it seems reasonable to return either light duty or with some lifiing restrictions based on her job requirements, but she will talk with her surgeon about that. She understands PT can not make those decisions. Otherwise, she has no need for PT at this time. She appears to be healing normally, has regained full shoulder ROM and has no signs of early lymphedema.    Rehab Potential  Excellent    PT Treatment/Interventions  ADLs/Self Care Home Management;Therapeutic exercise;Patient/family education    PT Next Visit Plan  D/C    PT Home Exercise Plan  Post op shoulder ROM HEP    Consulted and Agree with Plan of Care  Patient       Patient will benefit from skilled therapeutic intervention in order to improve the following deficits and impairments:  Decreased range of motion, Postural dysfunction, Pain, Impaired UE functional use, Decreased knowledge of precautions  Visit Diagnosis: Abnormal posture  Malignant neoplasm of upper-inner quadrant of left breast in female, estrogen receptor positive [facility])  Aftercare following surgery for neoplasm     Problem List Patient Active Problem List   Diagnosis Date Noted  . Malignant neoplasm of upper-inner quadrant of left breast in female, estrogen receptor positive ([place]) 12/23/2017   PHYSICAL THERAPY DISCHARGE SUMMARY  Visits from Start of Care: 2  Current functional level related to goals / functional outcomes: Goals met. See above for objective findings.   Remaining deficits: Mild edema present left upper outer breast.   Education / Equipment: HEP and lymphedema risk reduction information. Plan: Patient agrees to discharge.  Patient goals were met. Patient is being discharged due to meeting the stated rehab goals.  ?????         Annia Friendly, [place] 02/01/18 2:50 PM  [place], [place], [address] Phone: [phone]   Fax:  [phone]  Name: Carly Farmer MRN: [id number] Date of Birth: Oct 24, 1960

## 2018-02-02 ENCOUNTER — Telehealth: Payer: Self-pay | Admitting: *Deleted

## 2018-02-02 DIAGNOSIS — C50212 Malignant neoplasm of upper-inner quadrant of left female breast: Secondary | ICD-10-CM

## 2018-02-02 DIAGNOSIS — Z170 Estrogen receptor positive status [ER+]: Principal | ICD-10-CM

## 2018-02-02 NOTE — Telephone Encounter (Signed)
Received oncotype results of 15/4%.  Patient is aware.  Referral placed for Dr. Sondra Come.

## 2018-02-03 ENCOUNTER — Telehealth: Payer: Self-pay

## 2018-02-03 ENCOUNTER — Encounter (HOSPITAL_COMMUNITY): Payer: Self-pay | Admitting: Hematology and Oncology

## 2018-02-03 NOTE — Telephone Encounter (Signed)
Pt VM received. 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 Pt stated on VM "this has been the worst experience I've ever had". Loma Sousa, RN BSN    Returned pt's call. Conveyed to pt that with radiation, she would most likely not be able to work. Conveyed to pt that we would be able to complete paperwork for her employer. Pt states she will bring paperwork at 12/2 appt. Described to pt process for 12/2 and answered questions. Briefly discussed common side effects with radiation therapy to breast. Answered pt's questions to pt's satisfaction. Pt verbalized understanding and agreement. Loma Sousa, RN BSN

## 2018-02-07 NOTE — Progress Notes (Signed)
Location of Breast Cancer: Malignant neoplasm of upper-inner quadrant of left breast in female, estrogen receptor positive   Histology per Pathology Report: 01/19/18:  Diagnosis 1. Breast, lumpectomy, left with radioactive seed - INVASIVE AND IN SITU DUCTAL CARCINOMA, 1.2 CM, NOTTINGHAM GRADE 2. - MARGINS NOT INVOLVED. - INVASIVE CARCINOMA FOCALLY 0.1 CM FROM ANTERIOR MARGIN. - FOCAL LYMPHOVASCULAR SPACE INVOLVEMENT BY TUMOR. 2. Lymph node, sentinel, biopsy, left axillary - ONE BENIGN LYMPH NODE (0/1). 3. Breast, excision, left additional supero medial margin - BENIGN BREAST TISSUE. - NO EVIDENCE OF MALIGNANCY. - FINAL SUPEROMEDIAL MARGIN CLEAR.  Receptor Status: ER(100%), PR (90%), Her2-neu (negative), Ki-(12%)  Did patient present with symptoms (if so, please note symptoms) or was this found on screening mammography?: She had routine screening mammography on 12/09/2017 showing: possible distortion in the left breast. She underwent unilateral left diagnostic mammography with tomography and left breast ultrasonography at [facility] on 12/14/2017 showing: UPPER INNER LEFT breast distortion with 0.8 cm sonographic correlate. Tissue sampling is recommended. No abnormal appearing LEFT axillary lymph nodes  Accordingly on 12/18/2017 she proceeded to biopsy of the left breast area in question. The pathology from this procedure showed: Breast, left, needle core biopsy, 11 o'clock with invasive ductal carcinoma. Ductal carcinoma in situ. Prognostic indicators significant for: estrogen receptor, 100% positive and progesterone receptor, 90% positive, both with strong staining intensity. Proliferation marker Ki67 at 12%. HER2 negative.  Past/Anticipated interventions by surgeon, if any: 01/19/18: Procedure: Procedure(s): BREAST LUMPECTOMY WITH RADIOACTIVE SEED AND SENTINEL LYMPH NODE BIOPSY   Surgeon: Excell Seltzer T   Past/Anticipated interventions by medical oncology, if any: Chemotherapy  Per Dr. Lindi Adie 01/27/18:  ASSESSMENT & PLAN:  Malignant neoplasm of upper-inner quadrant of left breast in female, estrogen receptor positive ([place]) 01/19/2018:Left lumpectomy: IDC with DCIS, 1.2 cm, grade 2, margins negative, focal lymphovascular space invasion by tumor, 0/1 lymph node negative, additional superomedial margin benign, ER 100%, PR 90%, HER-2 negative, Ki-67 12%, T1CN0 stage Ia  Pathology counseling: I discussed the final pathology report of the patient provided  a copy of this report. I discussed the margins as well as lymph node surgeries. We also discussed the final staging along with previously performed ER/PR and HER-2/neu testing.  Recommendation: 1. Oncotype DX testing to determine if chemotherapy would be of any benefit followed by 2. Adjuvant radiation therapy followed by 3. Adjuvant antiestrogen therapy  Lymphedema issues, if any:  Patient  States that she has some swelling. In her breast area.  Pain issues, if any: Patient reports sharp shooting and dull pains in the breast area.  SAFETY ISSUES:  Prior radiation? No  Pacemaker/ICD? No  Possible current pregnancy? No, pt is post-surgical  Is the patient on methotrexate? No  Current Complaints / other details:  Patient reports that she has a little incision on her breast that bleeds sometime.    Loma Sousa, RN 02/07/2018,9:48 AM  Vitals:   02/15/18 0858 02/15/18 0907  BP: 134/86 134/86  Pulse: 75 75  Resp: 20 20  Temp: 97.7 F (36.5 C) 97.7 F (36.5 C)  TempSrc: Oral Oral  SpO2: 100% 100%  Weight: 230 lb (104.3 kg) 230 lb (104.3 kg)  Height: 5' 6.5" (1.689 m)    Wt Readings from Last 3 Encounters:  02/15/18 230 lb (104.3 kg)  01/27/18 227 lb 14.4 oz (103.4 kg)  01/19/18 230 lb 9.6 oz (104.6 kg)      Per Dr. Lindi Adie 01/27/18:  Her work is pressuring her to indicate when  she will come back to work.  She lifts 50 to 70 pound weights.  I do not think she should be lifting those weights  during radiation.  We will send additional paperwork depending upon Oncotype DX test results.  We will need to know whether or not she needs chemotherapy before deciding on her return to work.

## 2018-02-09 ENCOUNTER — Telehealth: Payer: Self-pay | Admitting: Hematology and Oncology

## 2018-02-09 NOTE — Telephone Encounter (Signed)
FAXED RECORDS TO [facility] RELEASE ID [id number]

## 2018-02-15 ENCOUNTER — Ambulatory Visit
Admission: RE | Admit: 2018-02-15 | Discharge: 2018-02-15 | Disposition: A | Discharge status: Home / Self Care | Payer: 59 | Source: Ambulatory Visit | Attending: Radiation Oncology | Admitting: Radiation Oncology

## 2018-02-15 ENCOUNTER — Encounter: Payer: Self-pay | Admitting: Radiation Oncology

## 2018-02-15 ENCOUNTER — Telehealth: Payer: Self-pay | Admitting: Radiation Oncology

## 2018-02-15 ENCOUNTER — Other Ambulatory Visit: Payer: Self-pay

## 2018-02-15 VITALS — BP 134/86 | HR 75 | Temp 97.7°F | Resp 20 | Ht 66.5 in | Wt 230.0 lb

## 2018-02-15 DIAGNOSIS — Z510 Encounter for antineoplastic radiation therapy: Secondary | ICD-10-CM | POA: Diagnosis not present

## 2018-02-15 DIAGNOSIS — C50212 Malignant neoplasm of upper-inner quadrant of left female breast: Secondary | ICD-10-CM

## 2018-02-15 DIAGNOSIS — Z170 Estrogen receptor positive status [ER+]: Secondary | ICD-10-CM | POA: Diagnosis not present

## 2018-02-15 DIAGNOSIS — Z9889 Other specified postprocedural states: Secondary | ICD-10-CM | POA: Diagnosis not present

## 2018-02-15 DIAGNOSIS — Z79899 Other long term (current) drug therapy: Secondary | ICD-10-CM | POA: Diagnosis not present

## 2018-02-15 NOTE — Progress Notes (Signed)
Radiation Oncology         [phone]) [phone] ________________________________  Name: Carly Farmer MRN: [id number]  Date: 02/15/2018  DOB: 15-Jan-1961  SIMULATION AND TREATMENT PLANNING NOTE    ICD-10-CM   1. Malignant neoplasm of upper-inner quadrant of left breast in female, estrogen receptor positive ([place]) C50.212    Z17.0     DIAGNOSIS:  Malignant neoplasm of upper-inner quadrant of left breast in female, estrogen receptor positive ([place]). 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  Identity was confirmed.  All relevant records and images related to the planned course of therapy were reviewed.  The patient freely provided informed written consent to proceed with treatment after reviewing the details related to the planned course of therapy. The consent form was witnessed and verified by the simulation staff.  Then, the patient was set-up in a stable reproducible  supine position for radiation therapy.  CT images were obtained.  Surface markings were placed.  The CT images were loaded into the planning software.  Then the target and avoidance structures were contoured.  Treatment planning then occurred.  The radiation prescription was entered and confirmed.  Then, I designed and supervised the construction of a total of 3 medically necessary complex treatment devices.  I have requested : 3D Simulation  I have requested a DVH of the following structures: heart, lungs, lumpectomy cavity.  I have ordered:CBC  PLAN:  The patient will receive 50.4 Gy in 28 fractions followed by a boost to the lumpectomy cavity of 12 gray in 6 fractions for a cumulative dose of 62.4 gray.   Optical Surface Tracking Plan:  Since intensity modulated radiotherapy (IMRT) and 3D conformal radiation treatment methods are predicated on accurate and precise positioning for treatment,  intrafraction motion monitoring is medically necessary to ensure accurate and safe treatment delivery.  The ability to quantify intrafraction motion without excessive ionizing radiation dose can only be performed with optical surface tracking. Accordingly, surface imaging offers the opportunity to obtain 3D measurements of patient position throughout IMRT and 3D treatments without excessive radiation exposure.  I am ordering optical surface tracking for this patient's upcoming course of radiotherapy. ________________________________  Special treatment procedure was performed today due to the extra time and effort required by myself to plan and prepare this patient for deep inspiration breath hold technique.  I have determined cardiac sparing to be of benefit to this patient to prevent long term cardiac damage due to radiation of the heart.  Bellows were placed on the patient's abdomen. To facilitate cardiac sparing, the patient was coached by the radiation therapists on breath hold techniques and breathing practice was performed. Practice waveforms were obtained. The patient was then scanned while maintaining breath hold in the treatment position.  This image was then transferred over to the imaging specialist. The imaging specialist then created a fusion of the free breathing and breath hold scans using the chest wall as the stable structure. I personally reviewed the fusion in axial, coronal and sagittal image planes.  Excellent cardiac sparing was obtained.  I felt the patient is an appropriate candidate for breath hold and the patient will be treated as such.  The image fusion was then reviewed with the patient to reinforce the necessity of reproducible breath hold.   -----------------------------------  Blair Promise, PhD, MD  This document serves as a record of services personally performed by Gery Pray, MD.  It was created on his behalf by Wyn Quaker, a trained medical scribe. The  creation of this record is based on the scribe's personal observations and the provider's statements to them. This document has been checked and approved by the attending provider.

## 2018-02-15 NOTE — Telephone Encounter (Signed)
Rec'd FMLA paperwork to be completed ASAP. Patient upset. I have given it to Dr. Sondra Come.

## 2018-02-15 NOTE — Progress Notes (Signed)
Radiation Oncology         [phone] ________________________________  Name: Carly Farmer MRN: [id number]  Date: 02/15/2018  DOB: [birth year]  Reevaluation Visit Note  CC: Jaclynn Major, NP  Nicholas Lose, MD    ICD-10-CM   1. Malignant neoplasm of upper-inner quadrant of left breast in female, estrogen receptor positive [facility]) C50.212 Ambulatory referral to Social Work   Z17.0     Diagnosis:   Malignant neoplasm of upper-inner quadrant of left breast in female, estrogen receptor positive ([place]). 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  she is doing well overall. She is unaccompanied today.    on 01/19/18 she underwent a left lumpectomy and sentinel node procedure which showed: Breast, lumpectomy, left with radioactive seed - invasive and in situ ductal carcinoma, 1.2 cm, nottingham grade 2 with margins not involved, as well as invasive carcinoma focally 0.1 cm from anterior margin and focal lymphovascular space involvement by tumor. Lymph node, sentinel, biopsy, left axillary showed one benign lymph node (0/1). Breast, excision, left additional supero medial margin showed benign breast tissue and no evidence of malignancy as well as the final superomedial margin clear.  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 Since she is only 4 weeks out from her surgery, this is not recommended.    On review of systems, she reports minor swelling in her left axilla, for which she is wearing a compression bra. she denies numbness, tingling and any other symptoms. Pertinent positives are listed and detailed within the above HPI.                 ALLERGIES:  is allergic to codeine.  Meds: Current Outpatient Medications  Medication Sig Dispense Refill  . phentermine 37.5 MG capsule Take  37.5 mg by mouth every morning.    . sertraline (ZOLOFT) 25 MG tablet Take 25 mg by mouth daily.    . traMADol (ULTRAM) 50 MG tablet Take 1 tablet (50 mg total) by mouth every 6 (six) hours as needed. (Patient not taking: Reported on 02/15/2018) 10 tablet 1   No current facility-administered medications for this encounter.     Physical Findings: The patient is in no acute distress. Patient is alert and oriented.  height is 5' 6.5" (1.689 m) and weight is 230 lb (104.3 kg). Her oral temperature is 97.7 F (36.5 C). Her blood pressure is 134/86 and her pulse is 75. Her respiration is 20 and oxygen saturation is 100%. .   Lungs are clear to auscultation bilaterally. Heart has regular rate and rhythm. No palpable cervical, supraclavicular, or axillary adenopathy. Abdomen soft, non-tender, normal bowel sounds. Left breast with a well-healing scar in the UIQ. No signs of drainage or infection. No nipple discharge or bleeding, no dominant mass or seroma appreciated.  Axillary scar is well-healed.  Lab Findings: Lab Results  Component Value Date   WBC 5.3 12/30/2017   HGB 12.9 12/30/2017   HCT 40.8 12/30/2017   MCV 91.5 12/30/2017   PLT 231 12/30/2017    Radiographic Findings: Nm Sentinel Node Inj-no Rpt (breast)  Result Date: 01/19/2018 Sulfur colloid was injected by the nuclear medicine technologist for melanoma sentinel node.   Mm Breast Surgical Specimen  Result Date: 01/19/2018 CLINICAL DATA:  Radioactive seed localization was performed prior  to lumpectomy for invasive ductal carcinoma and DCIS of the left breast. EXAM: SPECIMEN RADIOGRAPH OF THE LEFT BREAST COMPARISON:  Previous exam(s). FINDINGS: Status post excision of the left breast. The radioactive seed and biopsy marker clip are present, completely intact, and were marked for pathology. IMPRESSION: Specimen radiograph of the left breast. Electronically Signed   By: Curlene Dolphin M.D.   On: 01/19/2018 08:18    Impression:  pT1c,  pN0 invasive ductal carcinoma of the left breast  Patient would be a good candidate for breast conservation with radiation therapy directed at the left breast.  Today, I talked to the patient about the findings and work-up thus far.  We discussed the natural history of her disease and general treatment, highlighting the role of radiotherapy in the management.  We discussed the available radiation techniques, and focused on the details of logistics and delivery.  We reviewed the anticipated acute and late sequelae associated with radiation in this setting.  The patient was encouraged to ask questions that I answered to the best of my ability.  A patient consent form was discussed and signed.  We retained a copy for our records.  The patient would like to proceed with radiation and will be scheduled for CT simulation.   Plan:  She is scheduled for CT simulation today at 11:00AM. She will start her treatments next week. She will receive 6.5 weeks of radiation therapy. I doubt she would be a good candidate for hypofractionated treatment given her breast size. 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 appropriate papers filled out today concerning this issue.  ____________________________________   Blair Promise, PhD, MD    This document serves as a record of services personally performed by Gery Pray, MD. It was created on his behalf by Mary-Margaret Loma Messing, a trained medical scribe. The creation of this record is based on the scribe's personal observations and the provider's statements to them. This document has been checked and approved by the attending provider.

## 2018-02-16 ENCOUNTER — Encounter: Payer: Self-pay | Admitting: General Practice

## 2018-02-16 ENCOUNTER — Telehealth: Payer: Self-pay | Admitting: Hematology and Oncology

## 2018-02-16 NOTE — Progress Notes (Signed)
[facility] Psychosocial Distress Screening Clinical Social Work  Clinical Social Work was referred by distress screening protocol.  The patient scored a 5 on the Psychosocial Distress Thermometer which indicates moderate distress. Clinical Social Worker contacted patient by phone to assess for distress and other psychosocial needs. "Most of my distress is coming because when I ask for something for work is that I am not getting things done in a timely manner."  "I cant get what I need from these doctors and it's driving me crazy."  Feels frustrated because she's concerned that her work is not getting the paperwork to justify her absence during treatment.  This is a major source of stress for patient, in addition to cancer diagnosis and treatment.  CSW will reach out to nurse navigator and FMLA/disability coordinator at [facility] to ask that they call patient.    ONCBCN DISTRESS SCREENING 02/15/2018  Screening Type Initial Screening  Distress experienced in past week (1-10) 5  Practical problem type Insurance;Work/school  Emotional problem type Nervousness/Anxiety;Adjusting to illness;Boredom;Adjusting to appearance changes  Information Concerns Type Lack of info about treatment  Physical Problem type Pain    Clinical Social Worker follow up needed: No.   If yes, follow up plan:  Beverely Pace, [place], LCSW Clinical Social Worker Phone:  [phone]

## 2018-02-16 NOTE — Telephone Encounter (Signed)
Scheduled appt per 12/3 sch message- sent reminder letter in the mail with appt date and time   

## 2018-02-17 DIAGNOSIS — C50212 Malignant neoplasm of upper-inner quadrant of left female breast: Secondary | ICD-10-CM | POA: Diagnosis not present

## 2018-02-22 ENCOUNTER — Ambulatory Visit
Admission: RE | Admit: 2018-02-22 | Discharge: 2018-02-22 | Disposition: A | Discharge status: Home / Self Care | Payer: 59 | Source: Ambulatory Visit | Attending: Radiation Oncology | Admitting: Radiation Oncology

## 2018-02-22 DIAGNOSIS — C50212 Malignant neoplasm of upper-inner quadrant of left female breast: Secondary | ICD-10-CM

## 2018-02-22 DIAGNOSIS — Z170 Estrogen receptor positive status [ER+]: Principal | ICD-10-CM

## 2018-02-22 NOTE — Progress Notes (Signed)
  Radiation Oncology         [phone] ________________________________  Name: Carly Farmer MRN: [id number]  Date: 02/22/2018  DOB: 1960/03/26  Simulation Verification Note    ICD-10-CM   1. Malignant neoplasm of upper-inner quadrant of left breast in female, estrogen receptor positive ([place]) C50.212    Z17.0     Status: outpatient  NARRATIVE: The patient was brought to the treatment unit and placed in the planned treatment position. The clinical setup was verified. Then port films were obtained and uploaded to the radiation oncology medical record software.  The treatment beams were carefully compared against the planned radiation fields. The position location and shape of the radiation fields was reviewed. They targeted volume of tissue appears to be appropriately covered by the radiation beams. Organs at risk appear to be excluded as planned.  Based on my personal review, I approved the simulation verification. The patient's treatment will proceed as planned.  -----------------------------------  Blair Promise, PhD, MD  This document serves as a record of services personally performed by Gery Pray, MD. It was created on his behalf by Mary-Margaret Loma Messing, a trained medical scribe. The creation of this record is based on the scribe's personal observations and the provider's statements to them. This document has been checked and approved by the attending provider.

## 2018-02-23 ENCOUNTER — Ambulatory Visit
Admission: RE | Admit: 2018-02-23 | Discharge: 2018-02-23 | Disposition: A | Discharge status: Home / Self Care | Payer: 59 | Source: Ambulatory Visit | Attending: Radiation Oncology | Admitting: Radiation Oncology

## 2018-02-23 DIAGNOSIS — Z170 Estrogen receptor positive status [ER+]: Principal | ICD-10-CM

## 2018-02-23 DIAGNOSIS — C50212 Malignant neoplasm of upper-inner quadrant of left female breast: Secondary | ICD-10-CM

## 2018-02-23 MED ORDER — RADIAPLEXRX EX GEL
Freq: Once | CUTANEOUS | Status: AC
  Administered 2018-02-23: 17:00:00 via TOPICAL

## 2018-02-23 MED ORDER — ALRA NON-METALLIC DEODORANT (RAD-ONC)
1.0000 "application " | Freq: Once | TOPICAL | Status: AC
  Administered 2018-02-23: 1 via TOPICAL

## 2018-02-23 NOTE — Progress Notes (Signed)
Pt here for patient teaching.  Pt given Radiation and You booklet, skin care instructions, Alra deodorant and Radiaplex gel.  Reviewed areas of pertinence such as fatigue, hair loss, skin changes, breast tenderness and breast swelling . Pt able to give teach back of to pat skin and use unscented/gentle soap,apply Radiaplex bid, avoid applying anything to skin within 4 hours of treatment, avoid wearing an under wire bra and to use an electric razor if they must shave. Pt demonstrated understanding and verbalizes understanding of information given and will contact nursing with any questions or concerns.     [email or web address]  Loma Sousa, RN BSN

## 2018-02-24 ENCOUNTER — Ambulatory Visit
Admission: RE | Admit: 2018-02-24 | Discharge: 2018-02-24 | Disposition: A | Discharge status: Home / Self Care | Payer: 59 | Source: Ambulatory Visit | Attending: Radiation Oncology | Admitting: Radiation Oncology

## 2018-02-24 DIAGNOSIS — C50212 Malignant neoplasm of upper-inner quadrant of left female breast: Secondary | ICD-10-CM | POA: Diagnosis not present

## 2018-02-25 ENCOUNTER — Ambulatory Visit
Admission: RE | Admit: 2018-02-25 | Discharge: 2018-02-25 | Disposition: A | Discharge status: Home / Self Care | Payer: 59 | Source: Ambulatory Visit | Attending: Radiation Oncology | Admitting: Radiation Oncology

## 2018-02-25 DIAGNOSIS — C50212 Malignant neoplasm of upper-inner quadrant of left female breast: Secondary | ICD-10-CM | POA: Diagnosis not present

## 2018-02-26 ENCOUNTER — Ambulatory Visit
Admission: RE | Admit: 2018-02-26 | Discharge: 2018-02-26 | Disposition: A | Discharge status: Home / Self Care | Payer: 59 | Source: Ambulatory Visit | Attending: Radiation Oncology | Admitting: Radiation Oncology

## 2018-02-26 DIAGNOSIS — C50212 Malignant neoplasm of upper-inner quadrant of left female breast: Secondary | ICD-10-CM | POA: Diagnosis not present

## 2018-03-01 ENCOUNTER — Ambulatory Visit
Admission: RE | Admit: 2018-03-01 | Discharge: 2018-03-01 | Disposition: A | Discharge status: Home / Self Care | Payer: 59 | Source: Ambulatory Visit | Attending: Radiation Oncology | Admitting: Radiation Oncology

## 2018-03-01 DIAGNOSIS — C50212 Malignant neoplasm of upper-inner quadrant of left female breast: Secondary | ICD-10-CM | POA: Diagnosis not present

## 2018-03-02 ENCOUNTER — Ambulatory Visit: Payer: 59

## 2018-03-03 ENCOUNTER — Ambulatory Visit
Admission: RE | Admit: 2018-03-03 | Discharge: 2018-03-03 | Disposition: A | Discharge status: Home / Self Care | Payer: 59 | Source: Ambulatory Visit | Attending: Radiation Oncology | Admitting: Radiation Oncology

## 2018-03-03 DIAGNOSIS — C50212 Malignant neoplasm of upper-inner quadrant of left female breast: Secondary | ICD-10-CM | POA: Diagnosis not present

## 2018-03-04 ENCOUNTER — Ambulatory Visit
Admission: RE | Admit: 2018-03-04 | Discharge: 2018-03-04 | Disposition: A | Discharge status: Home / Self Care | Payer: 59 | Source: Ambulatory Visit | Attending: Radiation Oncology | Admitting: Radiation Oncology

## 2018-03-04 DIAGNOSIS — C50212 Malignant neoplasm of upper-inner quadrant of left female breast: Secondary | ICD-10-CM | POA: Diagnosis not present

## 2018-03-05 ENCOUNTER — Ambulatory Visit
Admission: RE | Admit: 2018-03-05 | Discharge: 2018-03-05 | Disposition: A | Discharge status: Home / Self Care | Payer: 59 | Source: Ambulatory Visit | Attending: Radiation Oncology | Admitting: Radiation Oncology

## 2018-03-05 DIAGNOSIS — C50212 Malignant neoplasm of upper-inner quadrant of left female breast: Secondary | ICD-10-CM | POA: Diagnosis not present

## 2018-03-08 ENCOUNTER — Ambulatory Visit
Admission: RE | Admit: 2018-03-08 | Discharge: 2018-03-08 | Disposition: A | Discharge status: Home / Self Care | Payer: 59 | Source: Ambulatory Visit | Attending: Radiation Oncology | Admitting: Radiation Oncology

## 2018-03-08 DIAGNOSIS — C50212 Malignant neoplasm of upper-inner quadrant of left female breast: Secondary | ICD-10-CM | POA: Diagnosis not present

## 2018-03-09 ENCOUNTER — Ambulatory Visit
Admission: RE | Admit: 2018-03-09 | Discharge: 2018-03-09 | Disposition: A | Discharge status: Home / Self Care | Payer: 59 | Source: Ambulatory Visit | Attending: Radiation Oncology | Admitting: Radiation Oncology

## 2018-03-09 DIAGNOSIS — C50212 Malignant neoplasm of upper-inner quadrant of left female breast: Secondary | ICD-10-CM | POA: Diagnosis not present

## 2018-03-11 ENCOUNTER — Ambulatory Visit
Admission: RE | Admit: 2018-03-11 | Discharge: 2018-03-11 | Disposition: A | Discharge status: Home / Self Care | Payer: 59 | Source: Ambulatory Visit | Attending: Radiation Oncology | Admitting: Radiation Oncology

## 2018-03-11 DIAGNOSIS — C50212 Malignant neoplasm of upper-inner quadrant of left female breast: Secondary | ICD-10-CM | POA: Diagnosis not present

## 2018-03-12 ENCOUNTER — Ambulatory Visit
Admission: RE | Admit: 2018-03-12 | Discharge: 2018-03-12 | Disposition: A | Discharge status: Home / Self Care | Payer: 59 | Source: Ambulatory Visit | Attending: Radiation Oncology | Admitting: Radiation Oncology

## 2018-03-12 DIAGNOSIS — C50212 Malignant neoplasm of upper-inner quadrant of left female breast: Secondary | ICD-10-CM | POA: Diagnosis not present

## 2018-03-15 ENCOUNTER — Ambulatory Visit
Admission: RE | Admit: 2018-03-15 | Discharge: 2018-03-15 | Disposition: A | Discharge status: Home / Self Care | Payer: 59 | Source: Ambulatory Visit | Attending: Radiation Oncology | Admitting: Radiation Oncology

## 2018-03-15 DIAGNOSIS — C50212 Malignant neoplasm of upper-inner quadrant of left female breast: Secondary | ICD-10-CM | POA: Diagnosis not present

## 2018-03-15 DIAGNOSIS — C50912 Malignant neoplasm of unspecified site of left female breast: Secondary | ICD-10-CM | POA: Diagnosis not present

## 2018-03-16 ENCOUNTER — Ambulatory Visit
Admission: RE | Admit: 2018-03-16 | Discharge: 2018-03-16 | Disposition: A | Discharge status: Home / Self Care | Payer: 59 | Source: Ambulatory Visit | Attending: Radiation Oncology | Admitting: Radiation Oncology

## 2018-03-16 DIAGNOSIS — C50212 Malignant neoplasm of upper-inner quadrant of left female breast: Secondary | ICD-10-CM | POA: Diagnosis not present

## 2018-03-18 ENCOUNTER — Ambulatory Visit
Admission: RE | Admit: 2018-03-18 | Discharge: 2018-03-18 | Disposition: A | Discharge status: Home / Self Care | Payer: 59 | Source: Ambulatory Visit | Attending: Radiation Oncology | Admitting: Radiation Oncology

## 2018-03-18 DIAGNOSIS — C50212 Malignant neoplasm of upper-inner quadrant of left female breast: Secondary | ICD-10-CM | POA: Insufficient documentation

## 2018-03-18 DIAGNOSIS — Z79811 Long term (current) use of aromatase inhibitors: Secondary | ICD-10-CM | POA: Diagnosis not present

## 2018-03-18 DIAGNOSIS — Z923 Personal history of irradiation: Secondary | ICD-10-CM | POA: Diagnosis not present

## 2018-03-18 DIAGNOSIS — Z170 Estrogen receptor positive status [ER+]: Secondary | ICD-10-CM | POA: Insufficient documentation

## 2018-03-18 DIAGNOSIS — Z510 Encounter for antineoplastic radiation therapy: Secondary | ICD-10-CM

## 2018-03-18 DIAGNOSIS — Z79899 Other long term (current) drug therapy: Secondary | ICD-10-CM | POA: Insufficient documentation

## 2018-03-19 ENCOUNTER — Ambulatory Visit
Admission: RE | Admit: 2018-03-19 | Discharge: 2018-03-19 | Disposition: A | Discharge status: Home / Self Care | Payer: 59 | Source: Ambulatory Visit | Attending: Radiation Oncology | Admitting: Radiation Oncology

## 2018-03-19 DIAGNOSIS — C50212 Malignant neoplasm of upper-inner quadrant of left female breast: Secondary | ICD-10-CM | POA: Diagnosis not present

## 2018-03-22 ENCOUNTER — Ambulatory Visit
Admission: RE | Admit: 2018-03-22 | Discharge: 2018-03-22 | Disposition: A | Discharge status: Home / Self Care | Payer: 59 | Source: Ambulatory Visit | Attending: Radiation Oncology | Admitting: Radiation Oncology

## 2018-03-22 DIAGNOSIS — C50212 Malignant neoplasm of upper-inner quadrant of left female breast: Secondary | ICD-10-CM | POA: Diagnosis not present

## 2018-03-23 ENCOUNTER — Ambulatory Visit
Admission: RE | Admit: 2018-03-23 | Discharge: 2018-03-23 | Disposition: A | Discharge status: Home / Self Care | Payer: 59 | Source: Ambulatory Visit | Attending: Radiation Oncology | Admitting: Radiation Oncology

## 2018-03-23 DIAGNOSIS — C50212 Malignant neoplasm of upper-inner quadrant of left female breast: Secondary | ICD-10-CM | POA: Diagnosis not present

## 2018-03-23 DIAGNOSIS — Z170 Estrogen receptor positive status [ER+]: Principal | ICD-10-CM

## 2018-03-23 MED ORDER — RADIAPLEXRX EX GEL
Freq: Once | CUTANEOUS | Status: AC
  Administered 2018-03-23: 10:00:00 via TOPICAL

## 2018-03-24 ENCOUNTER — Ambulatory Visit
Admission: RE | Admit: 2018-03-24 | Discharge: 2018-03-24 | Disposition: A | Discharge status: Home / Self Care | Payer: 59 | Source: Ambulatory Visit | Attending: Radiation Oncology | Admitting: Radiation Oncology

## 2018-03-24 DIAGNOSIS — C50212 Malignant neoplasm of upper-inner quadrant of left female breast: Secondary | ICD-10-CM | POA: Diagnosis not present

## 2018-03-25 ENCOUNTER — Ambulatory Visit
Admission: RE | Admit: 2018-03-25 | Discharge: 2018-03-25 | Disposition: A | Discharge status: Home / Self Care | Payer: 59 | Source: Ambulatory Visit | Attending: Radiation Oncology | Admitting: Radiation Oncology

## 2018-03-25 DIAGNOSIS — C50212 Malignant neoplasm of upper-inner quadrant of left female breast: Secondary | ICD-10-CM | POA: Diagnosis not present

## 2018-03-26 ENCOUNTER — Other Ambulatory Visit: Payer: Self-pay

## 2018-03-26 ENCOUNTER — Ambulatory Visit
Admission: RE | Admit: 2018-03-26 | Discharge: 2018-03-26 | Disposition: A | Discharge status: Home / Self Care | Payer: 59 | Source: Ambulatory Visit | Attending: Radiation Oncology | Admitting: Radiation Oncology

## 2018-03-26 ENCOUNTER — Emergency Department (HOSPITAL_COMMUNITY): Payer: 59

## 2018-03-26 ENCOUNTER — Encounter (HOSPITAL_COMMUNITY): Payer: Self-pay

## 2018-03-26 ENCOUNTER — Emergency Department (HOSPITAL_COMMUNITY)
Admission: EM | Admit: 2018-03-26 | Discharge: 2018-03-27 | Disposition: A | Discharge status: Home / Self Care | Payer: 59 | Attending: Emergency Medicine | Admitting: Emergency Medicine

## 2018-03-26 DIAGNOSIS — R05 Cough: Secondary | ICD-10-CM

## 2018-03-26 DIAGNOSIS — R509 Fever, unspecified: Secondary | ICD-10-CM

## 2018-03-26 DIAGNOSIS — J069 Acute upper respiratory infection, unspecified: Secondary | ICD-10-CM | POA: Insufficient documentation

## 2018-03-26 DIAGNOSIS — Z79899 Other long term (current) drug therapy: Secondary | ICD-10-CM | POA: Insufficient documentation

## 2018-03-26 DIAGNOSIS — R059 Cough, unspecified: Secondary | ICD-10-CM

## 2018-03-26 DIAGNOSIS — J45909 Unspecified asthma, uncomplicated: Secondary | ICD-10-CM | POA: Insufficient documentation

## 2018-03-26 DIAGNOSIS — C50212 Malignant neoplasm of upper-inner quadrant of left female breast: Secondary | ICD-10-CM | POA: Diagnosis not present

## 2018-03-26 LAB — URINALYSIS, ROUTINE W REFLEX MICROSCOPIC
BACTERIA UA: NONE SEEN
BILIRUBIN URINE: NEGATIVE
Glucose, UA: NEGATIVE mg/dL
HGB URINE DIPSTICK: NEGATIVE
Ketones, ur: NEGATIVE mg/dL
NITRITE: NEGATIVE
PROTEIN: NEGATIVE mg/dL
SPECIFIC GRAVITY, URINE: 1.015 (ref 1.005–1.030)
pH: 5 (ref 5.0–8.0)

## 2018-03-26 LAB — COMPREHENSIVE METABOLIC PANEL
ALT: 13 U/L (ref 0–44)
AST: 14 U/L — AB (ref 15–41)
Albumin: 3.6 g/dL (ref 3.5–5.0)
Alkaline Phosphatase: 81 U/L (ref 38–126)
Anion gap: 9 (ref 5–15)
BUN: 13 mg/dL (ref 6–20)
CHLORIDE: 107 mmol/L (ref 98–111)
CO2: 24 mmol/L (ref 22–32)
Calcium: 8.7 mg/dL — ABNORMAL LOW (ref 8.9–10.3)
Creatinine, Ser: 0.9 mg/dL (ref 0.44–1.00)
Glucose, Bld: 112 mg/dL — ABNORMAL HIGH (ref 70–99)
POTASSIUM: 3.5 mmol/L (ref 3.5–5.1)
SODIUM: 140 mmol/L (ref 135–145)
Total Bilirubin: 0.7 mg/dL (ref 0.3–1.2)
Total Protein: 6.8 g/dL (ref 6.5–8.1)

## 2018-03-26 LAB — CBC WITH DIFFERENTIAL/PLATELET
Abs Immature Granulocytes: 0.07 10*3/uL (ref 0.00–0.07)
BASOS PCT: 1 %
Basophils Absolute: 0.1 10*3/uL (ref 0.0–0.1)
EOS ABS: 0.1 10*3/uL (ref 0.0–0.5)
Eosinophils Relative: 1 %
HCT: 39.5 % (ref 36.0–46.0)
Hemoglobin: 12.6 g/dL (ref 12.0–15.0)
IMMATURE GRANULOCYTES: 1 %
Lymphocytes Relative: 14 %
Lymphs Abs: 1.5 10*3/uL (ref 0.7–4.0)
MCH: 29.6 pg (ref 26.0–34.0)
MCHC: 31.9 g/dL (ref 30.0–36.0)
MCV: 92.9 fL (ref 80.0–100.0)
MONOS PCT: 9 %
Monocytes Absolute: 1 10*3/uL (ref 0.1–1.0)
NEUTROS ABS: 8.1 10*3/uL — AB (ref 1.7–7.7)
NEUTROS PCT: 74 %
PLATELETS: 202 10*3/uL (ref 150–400)
RBC: 4.25 MIL/uL (ref 3.87–5.11)
RDW: 13 % (ref 11.5–15.5)
WBC: 10.9 10*3/uL — AB (ref 4.0–10.5)
nRBC: 0 % (ref 0.0–0.2)

## 2018-03-26 LAB — INFLUENZA PANEL BY PCR (TYPE A & B)
INFLBPCR: NEGATIVE
Influenza A By PCR: NEGATIVE

## 2018-03-26 NOTE — ED Provider Notes (Signed)
[facility] DEPT Provider Note   CSN: [id number] Arrival date & time: 03/26/18  2050     History   Chief Complaint Chief Complaint  Patient presents with  . Fever    cancer patient    HPI Carly Farmer is a 58 y.o. female.  The history is provided by the patient and medical records. No language interpreter was used.  Cough  Cough characteristics:  Dry Sputum characteristics:  Unable to specify Severity:  Moderate Onset quality:  Gradual Duration:  1 week Timing:  Constant Chronicity:  New Relieved by:  Nothing Worsened by:  Nothing Ineffective treatments:  None tried Associated symptoms: chills, fever and rhinorrhea   Associated symptoms: no chest pain, no diaphoresis, no headaches, no rash, no shortness of breath and no wheezing   Fever:    Duration:  1 day   Timing:  Constant   Max temp PTA:  100.7   Temp source:  Oral   Progression:  Resolved   Past Medical History:  Diagnosis Date  . Anxiety   . Arthritis    hips and lt knee  . Asthma    adult onset  . Cancer ([place]) 12/2017   lef breast cancer  . Depression   . High cholesterol     Patient Active Problem List   Diagnosis Date Noted  . Malignant neoplasm of upper-inner quadrant of left breast in female, estrogen receptor positive ([place]) 12/23/2017    Past Surgical History:  Procedure Laterality Date  . BREAST LUMPECTOMY WITH RADIOACTIVE SEED AND SENTINEL LYMPH NODE BIOPSY Left 01/19/2018   Procedure: BREAST LUMPECTOMY WITH RADIOACTIVE SEED AND SENTINEL LYMPH NODE BIOPSY;  Surgeon: Excell Seltzer, MD;  Location: [facility];  Service: General;  Laterality: Left;  . CHOLECYSTECTOMY    . TUBAL LIGATION    . wisdom teeth removal       OB History   No obstetric history on file.      Home Medications    Prior to Admission medications   Medication Sig Start Date End Date Taking? Authorizing Provider  phentermine 37.5 MG capsule Take 37.5 mg by  mouth every morning.    [provider]  sertraline (ZOLOFT) 25 MG tablet Take 25 mg by mouth daily.    [provider]  traMADol (ULTRAM) 50 MG tablet Take 1 tablet (50 mg total) by mouth every 6 (six) hours as needed. Patient not taking: Reported on 02/15/2018 01/19/18   Excell Seltzer, MD    Family History Family History  Problem Relation Age of Onset  . Prostate cancer Father   . Bone cancer Father   . Breast cancer Maternal Aunt     Social History Social History   Tobacco Use  . Smoking status: Never Smoker  . Smokeless tobacco: Never Used  Substance Use Topics  . Alcohol use: Yes    Comment: occ  . Drug use: Never     Allergies   Codeine   Review of Systems Review of Systems  Constitutional: Positive for chills and fever. Negative for diaphoresis.  HENT: Positive for congestion and rhinorrhea.   Eyes: Negative for visual disturbance.  Respiratory: Positive for cough. Negative for chest tightness, shortness of breath, wheezing and stridor.   Cardiovascular: Negative for chest pain, palpitations and leg swelling.  Gastrointestinal: Positive for constipation. Negative for diarrhea, nausea and vomiting.  Genitourinary: Positive for decreased urine volume. Negative for dysuria and enuresis.  Musculoskeletal: Negative for back pain, neck pain and  neck stiffness.  Skin: Negative for rash and wound.  Neurological: Negative for dizziness, light-headedness and headaches.  Psychiatric/Behavioral: Negative for agitation and confusion.  All other systems reviewed and are negative.    Physical Exam Updated Vital Signs BP 115/87 (BP Location: Right Arm)   Pulse (!) 110   Temp 98.5 F (36.9 C) (Oral) Comment: Pt took 500 mg ibprofen at 18:00  Resp 16   Ht 5\' 7"  (1.702 m)   Wt 103.4 kg   SpO2 99%   BMI 35.71 kg/m   Physical Exam Vitals signs and nursing note reviewed.  Constitutional:      General: She is not in acute distress.    Appearance:  Normal appearance. She is not ill-appearing, toxic-appearing or diaphoretic.  HENT:     Head: Normocephalic.     Nose: Congestion and rhinorrhea present.     Mouth/Throat:     Mouth: Mucous membranes are moist.  Eyes:     Pupils: Pupils are equal, round, and reactive to light.  Neck:     Musculoskeletal: Normal range of motion. No neck rigidity or muscular tenderness.  Cardiovascular:     Rate and Rhythm: Normal rate.     Pulses: Normal pulses.     Heart sounds: No murmur.  Pulmonary:     Effort: Pulmonary effort is normal. No respiratory distress.     Breath sounds: Normal breath sounds. No stridor. No wheezing, rhonchi or rales.  Chest:     Chest wall: Tenderness present. No lacerations, deformity, swelling, crepitus or edema.     Breasts:        Right: No tenderness.        Left: Tenderness present.    Abdominal:     General: Abdomen is flat. There is no distension.     Tenderness: There is no abdominal tenderness.  Musculoskeletal:        General: Tenderness present. No swelling.     Right lower leg: No edema.     Left lower leg: No edema.  Skin:    Capillary Refill: Capillary refill takes less than 2 seconds.     Coloration: Skin is not jaundiced.     Findings: No rash.  Neurological:     General: No focal deficit present.     Mental Status: She is alert and oriented to person, place, and time.     Sensory: No sensory deficit.     Motor: No weakness.     Coordination: Coordination normal.  Psychiatric:        Mood and Affect: Mood normal.      ED Treatments / Results  Labs (all labs ordered are listed, but only abnormal results are displayed) Labs Reviewed  CBC WITH DIFFERENTIAL/PLATELET - Abnormal; Notable for the following components:      Result Value   WBC 10.9 (*)    Neutro Abs 8.1 (*)    All other components within normal limits  COMPREHENSIVE METABOLIC PANEL - Abnormal; Notable for the following components:   Glucose, Bld 112 (*)    Calcium 8.7  (*)    AST 14 (*)    All other components within normal limits  URINALYSIS, ROUTINE W REFLEX MICROSCOPIC - Abnormal; Notable for the following components:   Leukocytes, UA SMALL (*)    All other components within normal limits  URINE CULTURE  CULTURE, BLOOD (ROUTINE X 2)  CULTURE, BLOOD (ROUTINE X 2)  INFLUENZA PANEL BY PCR (TYPE A & B)    EKG None  Radiology Dg Chest 2 View  Result Date: 03/26/2018 CLINICAL DATA:  Fever EXAM: CHEST - 2 VIEW COMPARISON:  None. FINDINGS: The heart size and mediastinal contours are within normal limits. Both lungs are clear. Mild degenerative changes of the spine. IMPRESSION: No active cardiopulmonary disease. Electronically Signed   By: Donavan Foil M.D.   On: 03/26/2018 22:28    Procedures Procedures (including critical care time)  Medications Ordered in ED Medications - No data to display   Initial Impression / Assessment and Plan / ED Course  I have reviewed the triage vital signs and the nursing notes.  Pertinent labs & imaging results that were available during my care of the patient were reviewed by me and considered in my medical decision making (see chart for details).     Carly Farmer is a 58 y.o. female with a past medical history significant for asthma, depression, anxiety, hypercholesterolemia, and left sided breast cancer status post lumpectomy several months ago and radiation therapy most recently this morning who presents with fevers, chills, congestion, cough, rhinorrhea, decreased urination, and malaise.  She reports that she has had no chest pain or shortness of breath.  She does report that her left lateral axillary area has had some tenderness to palpation that has been ongoing since radiation began but denies any drainage.  She denies any history of abscesses.  She reports he had some constipation for some time but no diarrhea.  She denies recent trauma.  She denies any production of her cough as it is dry.  She denies any  shortness of breath currently.  She reports no history of DVT or PE and denies any leg pain or leg swelling.  She was told to come in for her fever.  On exam, lungs are clear.  Chest is nontender on the anterior chest.  Patient's left lateral chest where there is some discoloration of the skin from likely radiation, there is some deep tenderness but no fluctuance.  No crepitance.  Abdomen was nontender.  Extremities nontender.  No significant edema.  Normal pulses in extremities.  Vital signs reassuring aside from tachycardia on arrival which has resolved.  Clinically I suspect patient has a viral syndrome however as she has cancer, will get work-up to look for other abnormalities.  Patient will have influenza swab sent as well as chest x-ray.  She will screen laboratory testing.  Urinalysis will be sent due to the decreased urination.  Given lack of chest pain or shortness with a low suspicion for pulmonary embolism.  With the tenderness near the lumpectomy site, I suspect is due to radiation and not from abscess.  Doubt abscess would arise several months after surgery.  Suspect local changes due to radiation this morning.  Patient agreed for diagnostic work-up and discharge if symptoms improve and work-up is reassuring.  Patient's work-up is overall reassuring.  Mild leukocytosis of 10.9.  No anemia.  Metabolic panel reassuring.  Urinalysis shows no evidence of infection with no nitrites or bacteria.  Chest x-ray shows no pneumonia.  Suspect a viral cold and patient says that she may have interacted with someone who had a URI.  Flu test was negative.  Given patient's reassuring vital signs, no acute distress, minimal symptoms, and reassuring work-up, we feel she is safe for discharge home.  Patient will stay hydrated and follow-up with PCP.  Patient understood return precautions and had no other questions or concerns.  Patient discharged in good condition.  Final Clinical Impressions(s) /  ED  Diagnoses   Final diagnoses:  Upper respiratory tract infection, unspecified type  Cough  Fever, unspecified fever cause    ED Discharge Orders    None      Clinical Impression: 1. Upper respiratory tract infection, unspecified type   2. Cough   3. Fever, unspecified fever cause     Disposition: Discharge  Condition: Good  I have discussed the results, Dx and Tx plan with the pt(& family if present). He/she/they expressed understanding and agree(s) with the plan. Discharge instructions discussed at great length. Strict return precautions discussed and pt &/or family have verbalized understanding of the instructions. No further questions at time of discharge.    New Prescriptions   No medications on file    Follow Up: Jaclynn Major, NP [address] [place] [address] [facility] DEPT [place] [id number] [place]       Jaryn Hocutt, Gwenyth Allegra, MD 03/26/18 [phone]

## 2018-03-26 NOTE — Discharge Instructions (Signed)
Based on your work-up and symptoms we suspect you have a viral upper respiratory infection causing your symptoms.  We did not find evidence of pneumonia or other serious bacterial infection at this time.  Please follow-up with your primary physician as well as your oncology team for further management of your symptoms.  Please have your radiation site reassessed by her primary team if symptoms worsen.  If any other symptoms change or worsen, please return to the nearest emergency department.

## 2018-03-26 NOTE — ED Triage Notes (Signed)
Pt reports fever of 100.7 today. She had radiation this morning. A&Ox4. States that she has been more tired and achy than usual yesterday and today, but denies N/V/D. A&Ox4. Took ibuprofen PTA.

## 2018-03-26 NOTE — ED Notes (Signed)
Patient transported to X-ray 

## 2018-03-28 LAB — URINE CULTURE: Culture: <10000 — AB

## 2018-03-29 ENCOUNTER — Ambulatory Visit
Admission: RE | Admit: 2018-03-29 | Discharge: 2018-03-29 | Disposition: A | Discharge status: Home / Self Care | Payer: 59 | Source: Ambulatory Visit | Attending: Radiation Oncology | Admitting: Radiation Oncology

## 2018-03-29 DIAGNOSIS — C50212 Malignant neoplasm of upper-inner quadrant of left female breast: Secondary | ICD-10-CM | POA: Diagnosis not present

## 2018-03-30 ENCOUNTER — Ambulatory Visit
Admission: RE | Admit: 2018-03-30 | Discharge: 2018-03-30 | Disposition: A | Discharge status: Home / Self Care | Payer: 59 | Source: Ambulatory Visit | Attending: Radiation Oncology | Admitting: Radiation Oncology

## 2018-03-30 ENCOUNTER — Ambulatory Visit: Payer: 59 | Admitting: Radiation Oncology

## 2018-03-30 DIAGNOSIS — C50212 Malignant neoplasm of upper-inner quadrant of left female breast: Secondary | ICD-10-CM | POA: Diagnosis not present

## 2018-03-30 DIAGNOSIS — Z79811 Long term (current) use of aromatase inhibitors: Secondary | ICD-10-CM | POA: Diagnosis not present

## 2018-03-30 DIAGNOSIS — Z170 Estrogen receptor positive status [ER+]: Secondary | ICD-10-CM | POA: Diagnosis not present

## 2018-03-30 DIAGNOSIS — Z923 Personal history of irradiation: Secondary | ICD-10-CM | POA: Diagnosis not present

## 2018-03-30 NOTE — Assessment & Plan Note (Signed)
01/19/2018:Left lumpectomy: IDC with DCIS, 1.2 cm, grade 2, margins negative, focal lymphovascular space invasion by tumor, 0/1 lymph node negative, additional superomedial margin benign, ER 100%, PR 90%, HER-2 negative, Ki-67 12%, T1CN0 stage Ia Oncotype 15: 4% ROR Adj RT 01/12/18- 03/30/18  Plan: Adj anti estrogen therapy with Anastrozole 1 mg daily X 5-7 years We discussed the risks and benefits of anti-estrogen therapy with aromatase inhibitors. These include but not limited to insomnia, hot flashes, mood changes, vaginal dryness, bone density loss, and weight gain. We strongly believe that the benefits far outweigh the risks. Patient understands these risks and consented to starting treatment. Planned treatment duration is 5-7 years.  RTC in 3 months for SCP visit

## 2018-03-30 NOTE — Progress Notes (Signed)
Patient Care Team: Jaclynn Major, NP as PCP - General (Nurse Practitioner) Excell Seltzer, MD as Consulting Physician (General Surgery) Nicholas Lose, MD as Consulting Physician (Hematology and Oncology) Gery Pray, MD as Consulting Physician (Radiation Oncology)  DIAGNOSIS:    ICD-10-CM   1. Malignant neoplasm of upper-inner quadrant of left breast in female, estrogen receptor positive ([facility]) C50.212    Z17.0     SUMMARY OF ONCOLOGIC HISTORY:   Malignant neoplasm of upper-inner quadrant of left breast in female, estrogen receptor positive ([facility])   12/18/2017 Initial Diagnosis    Screening mammogram detected left breast distortion at 11 o'clock position measured 8 mm by ultrasound.  Axilla negative.  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  T1bN0 stage Ia    01/19/2018 Surgery    Left lumpectomy: IDC with DCIS, 1.2 cm, grade 2, margins negative, focal lymphovascular space invasion by tumor, 0/1 lymph node negative, additional superomedial margin benign, ER 100%, PR 90%, HER-2 negative, Ki-67 12%, T1CN0 stage Ia    02/23/2018 -  Radiation Therapy    Adjuvant XRT     CHIEF COMPLIANT: Follow up of radiation therapy to discuss anti-estrogen therapy  INTERVAL HISTORY: Carly Farmer is a 58 y.o. with above-mentioned history of left breast cancer who underwent a lumpectomy and is currently undergoing radiation since 02/23/18. It was determined she did not need chemotherapy based on Oncotype testing which showed a score of 15 and a 4% chance of recurrence in 9 years. On 03/26/18 she presented to the ED for a fever, and was found to have cold and discharged. She presents to the clinic today alone to discuss anti-estrogen therapy and notes she had hot flashes last night 3 times, but they are not regular. She reports stiffness in her hips and joints and attributes it to arthritis. She reports she has about 3 weeks left of  radiation.   REVIEW OF SYSTEMS:   Constitutional: Denies fevers, chills or abnormal weight loss (+) hot flashes  Eyes: Denies blurriness of vision Ears, nose, mouth, throat, and face: Denies mucositis or sore throat Respiratory: Denies cough, dyspnea or wheezes Cardiovascular: Denies palpitation, chest discomfort Gastrointestinal:  Denies nausea, heartburn or change in bowel habits Skin: Denies abnormal skin rashes MSK: (+) stiffness in hips and joints Lymphatics: Denies new lymphadenopathy or easy bruising Neurological: Denies numbness, tingling or new weaknesses Behavioral/Psych: Mood is stable, no new changes  Extremities: No lower extremity edema Breast: denies any pain or lumps or nodules in either breasts All other systems were reviewed with the patient and are negative.  I have reviewed the past medical history, past surgical history, social history and family history with the patient and they are unchanged from previous note.  ALLERGIES:  is allergic to cabbage and codeine.  MEDICATIONS:  Current Outpatient Medications  Medication Sig Dispense Refill  . [START ON 05/06/2018] anastrozole (ARIMIDEX) 1 MG tablet Take 1 tablet (1 mg total) by mouth daily. 90 tablet 3  . guaiFENesin (MUCINEX) 600 MG 12 hr tablet Take 600 mg by mouth 2 (two) times daily.    . phentermine 37.5 MG capsule Take 37.5 mg by mouth every morning.    . sertraline (ZOLOFT) 25 MG tablet Take 25 mg by mouth daily.    . traMADol (ULTRAM) 50 MG tablet Take 1 tablet (50 mg total) by mouth every 6 (six) hours as needed. (Patient not taking: Reported on 02/15/2018) 10 tablet 1  No current facility-administered medications for this visit.     PHYSICAL EXAMINATION: ECOG PERFORMANCE STATUS: 1 - Symptomatic but completely ambulatory  Vitals:   03/31/18 0814  BP: 103/60  Pulse: 82  Resp: 20  Temp: 98 F (36.7 C)  SpO2: 100%   Filed Weights   03/31/18 0814  Weight: 230 lb 14.4 oz (104.7 kg)     GENERAL:alert, no distress and comfortable SKIN: skin color, texture, turgor are normal, no rashes or significant lesions EYES: normal, Conjunctiva are pink and non-injected, sclera clear OROPHARYNX:no exudate, no erythema and lips, buccal mucosa, and tongue normal  NECK: supple, thyroid normal size, non-tender, without nodularity LYMPH:  no palpable lymphadenopathy in the cervical, axillary or inguinal LUNGS: clear to auscultation and percussion with normal breathing effort HEART: regular rate & rhythm and no murmurs and no lower extremity edema ABDOMEN:abdomen soft, non-tender and normal bowel sounds MUSCULOSKELETAL:no cyanosis of digits and no clubbing  NEURO: alert & oriented x 3 with fluent speech, no focal motor/sensory deficits EXTREMITIES: No lower extremity edema  LABORATORY DATA:  I have reviewed the data as listed CMP Latest Ref Rng & Units 03/26/2018 12/30/2017  Glucose 70 - 99 mg/dL 112(H) 110(H)  BUN 6 - 20 mg/dL 13 12  Creatinine 0.44 - 1.00 mg/dL 0.90 1.07(H)  Sodium 135 - 145 mmol/L 140 142  Potassium 3.5 - 5.1 mmol/L 3.5 3.7  Chloride 98 - 111 mmol/L 107 106  CO2 22 - 32 mmol/L 24 27  Calcium 8.9 - 10.3 mg/dL 8.7(L) 9.2  Total Protein 6.5 - 8.1 g/dL 6.8 6.5  Total Bilirubin 0.3 - 1.2 mg/dL 0.7 0.5  Alkaline Phos 38 - 126 U/L 81 75  AST 15 - 41 U/L 14(L) 12(L)  ALT 0 - 44 U/L 13 14    Lab Results  Component Value Date   WBC 10.9 (H) 03/26/2018   HGB 12.6 03/26/2018   HCT 39.5 03/26/2018   MCV 92.9 03/26/2018   PLT 202 03/26/2018   NEUTROABS 8.1 (H) 03/26/2018    ASSESSMENT & PLAN:  Malignant neoplasm of upper-inner quadrant of left breast in female, estrogen receptor positive ([facility]) 01/19/2018:Left lumpectomy: IDC with DCIS, 1.2 cm, grade 2, margins negative, focal lymphovascular space invasion by tumor, 0/1 lymph node negative, additional superomedial margin benign, ER 100%, PR 90%, HER-2 negative, Ki-67 12%, T1CN0 stage Ia Oncotype 15: 4% ROR Adj RT  02/23/18-    Plan: Adj anti estrogen therapy with Anastrozole 1 mg daily X 5-7 years to start 05/06/2018 We discussed the risks and benefits of anti-estrogen therapy with aromatase inhibitors. These include but not limited to insomnia, hot flashes, mood changes, vaginal dryness, bone density loss, and weight gain. We strongly believe that the benefits far outweigh the risks. Patient understands these risks and consented to starting treatment. Planned treatment duration is 5-7 years.  RTC in 3 months for SCP visit    No orders of the defined types were placed in this encounter.  The patient has a good understanding of the overall plan. she agrees with it. she will call with any problems that may develop before the next visit here.  Nicholas Lose, MD 03/31/2018   I, Cloyde Reams Dorshimer, am acting as scribe for Nicholas Lose, MD.  I have reviewed the above documentation for accuracy and completeness, and I agree with the above.

## 2018-03-31 ENCOUNTER — Inpatient Hospital Stay: Payer: 59 | Attending: Hematology and Oncology | Admitting: Hematology and Oncology

## 2018-03-31 ENCOUNTER — Ambulatory Visit
Admission: RE | Admit: 2018-03-31 | Discharge: 2018-03-31 | Disposition: A | Discharge status: Home / Self Care | Payer: 59 | Source: Ambulatory Visit | Attending: Radiation Oncology | Admitting: Radiation Oncology

## 2018-03-31 ENCOUNTER — Telehealth: Payer: Self-pay | Admitting: Hematology and Oncology

## 2018-03-31 DIAGNOSIS — Z79811 Long term (current) use of aromatase inhibitors: Secondary | ICD-10-CM | POA: Diagnosis not present

## 2018-03-31 DIAGNOSIS — C50212 Malignant neoplasm of upper-inner quadrant of left female breast: Secondary | ICD-10-CM

## 2018-03-31 DIAGNOSIS — Z923 Personal history of irradiation: Secondary | ICD-10-CM | POA: Diagnosis not present

## 2018-03-31 DIAGNOSIS — Z170 Estrogen receptor positive status [ER+]: Secondary | ICD-10-CM

## 2018-03-31 DIAGNOSIS — Z79899 Other long term (current) drug therapy: Secondary | ICD-10-CM

## 2018-03-31 MED ORDER — ANASTROZOLE 1 MG PO TABS: 1.0000 mg | ORAL_TABLET | Freq: Every day | ORAL | 3 refills | Status: DC

## 2018-03-31 NOTE — Telephone Encounter (Signed)
Gave avs and calendar ° °

## 2018-04-01 ENCOUNTER — Ambulatory Visit
Admission: RE | Admit: 2018-04-01 | Discharge: 2018-04-01 | Disposition: A | Discharge status: Home / Self Care | Payer: 59 | Source: Ambulatory Visit | Attending: Radiation Oncology | Admitting: Radiation Oncology

## 2018-04-01 ENCOUNTER — Ambulatory Visit: Payer: 59 | Admitting: Radiation Oncology

## 2018-04-01 DIAGNOSIS — C50212 Malignant neoplasm of upper-inner quadrant of left female breast: Secondary | ICD-10-CM | POA: Diagnosis not present

## 2018-04-01 DIAGNOSIS — Z170 Estrogen receptor positive status [ER+]: Principal | ICD-10-CM

## 2018-04-01 LAB — CULTURE, BLOOD (ROUTINE X 2)
CULTURE: NO GROWTH
Culture: NO GROWTH

## 2018-04-01 NOTE — Progress Notes (Signed)
.  Simulation verification  The patient was brought to the treatment machine and placed in the plan treatment position.  Clinical set up was verified to ensure that the target region is appropriately covered for the patient's upcoming electron boost treatment.  The targeted volume of tissue is appropriately covered by the radiation field.  Based on my personal review, I approve the simulation verification.  The patient's treatment will proceed as planned.  ------------------------------------------------  -----------------------------------  Carly Farmer D. Evelio Rueda, PhD, MD  

## 2018-04-02 ENCOUNTER — Ambulatory Visit
Admission: RE | Admit: 2018-04-02 | Discharge: 2018-04-02 | Disposition: A | Discharge status: Home / Self Care | Payer: 59 | Source: Ambulatory Visit | Attending: Radiation Oncology | Admitting: Radiation Oncology

## 2018-04-02 DIAGNOSIS — C50212 Malignant neoplasm of upper-inner quadrant of left female breast: Secondary | ICD-10-CM | POA: Diagnosis not present

## 2018-04-05 ENCOUNTER — Ambulatory Visit
Admission: RE | Admit: 2018-04-05 | Discharge: 2018-04-05 | Disposition: A | Discharge status: Home / Self Care | Payer: 59 | Source: Ambulatory Visit | Attending: Radiation Oncology | Admitting: Radiation Oncology

## 2018-04-05 DIAGNOSIS — R7303 Prediabetes: Secondary | ICD-10-CM | POA: Diagnosis not present

## 2018-04-05 DIAGNOSIS — C50919 Malignant neoplasm of unspecified site of unspecified female breast: Secondary | ICD-10-CM | POA: Diagnosis not present

## 2018-04-05 DIAGNOSIS — C50212 Malignant neoplasm of upper-inner quadrant of left female breast: Secondary | ICD-10-CM | POA: Diagnosis not present

## 2018-04-06 ENCOUNTER — Ambulatory Visit: Payer: 59

## 2018-04-06 ENCOUNTER — Ambulatory Visit
Admission: RE | Admit: 2018-04-06 | Discharge: 2018-04-06 | Disposition: A | Discharge status: Home / Self Care | Payer: 59 | Source: Ambulatory Visit | Attending: Radiation Oncology | Admitting: Radiation Oncology

## 2018-04-06 ENCOUNTER — Ambulatory Visit: Payer: 59 | Admitting: Radiation Oncology

## 2018-04-06 DIAGNOSIS — C50212 Malignant neoplasm of upper-inner quadrant of left female breast: Secondary | ICD-10-CM | POA: Diagnosis not present

## 2018-04-07 ENCOUNTER — Ambulatory Visit: Payer: 59

## 2018-04-07 ENCOUNTER — Ambulatory Visit
Admission: RE | Admit: 2018-04-07 | Discharge: 2018-04-07 | Disposition: A | Discharge status: Home / Self Care | Payer: 59 | Source: Ambulatory Visit | Attending: Radiation Oncology | Admitting: Radiation Oncology

## 2018-04-07 DIAGNOSIS — C50212 Malignant neoplasm of upper-inner quadrant of left female breast: Secondary | ICD-10-CM | POA: Diagnosis not present

## 2018-04-08 ENCOUNTER — Ambulatory Visit
Admission: RE | Admit: 2018-04-08 | Discharge: 2018-04-08 | Disposition: A | Discharge status: Home / Self Care | Payer: 59 | Source: Ambulatory Visit | Attending: Radiation Oncology | Admitting: Radiation Oncology

## 2018-04-08 DIAGNOSIS — C50212 Malignant neoplasm of upper-inner quadrant of left female breast: Secondary | ICD-10-CM | POA: Diagnosis not present

## 2018-04-09 ENCOUNTER — Ambulatory Visit
Admission: RE | Admit: 2018-04-09 | Discharge: 2018-04-09 | Disposition: A | Discharge status: Home / Self Care | Payer: 59 | Source: Ambulatory Visit | Attending: Radiation Oncology | Admitting: Radiation Oncology

## 2018-04-09 DIAGNOSIS — C50212 Malignant neoplasm of upper-inner quadrant of left female breast: Secondary | ICD-10-CM | POA: Diagnosis not present

## 2018-04-12 ENCOUNTER — Ambulatory Visit
Admission: RE | Admit: 2018-04-12 | Discharge: 2018-04-12 | Disposition: A | Discharge status: Home / Self Care | Payer: 59 | Source: Ambulatory Visit | Attending: Radiation Oncology | Admitting: Radiation Oncology

## 2018-04-12 DIAGNOSIS — C50212 Malignant neoplasm of upper-inner quadrant of left female breast: Secondary | ICD-10-CM | POA: Diagnosis not present

## 2018-04-13 ENCOUNTER — Ambulatory Visit
Admission: RE | Admit: 2018-04-13 | Discharge: 2018-04-13 | Disposition: A | Discharge status: Home / Self Care | Payer: 59 | Source: Ambulatory Visit | Attending: Radiation Oncology | Admitting: Radiation Oncology

## 2018-04-13 ENCOUNTER — Ambulatory Visit: Payer: 59

## 2018-04-13 ENCOUNTER — Encounter: Payer: Self-pay | Admitting: *Deleted

## 2018-04-13 DIAGNOSIS — C50212 Malignant neoplasm of upper-inner quadrant of left female breast: Secondary | ICD-10-CM | POA: Diagnosis not present

## 2018-04-14 ENCOUNTER — Ambulatory Visit
Admission: RE | Admit: 2018-04-14 | Discharge: 2018-04-14 | Disposition: A | Discharge status: Home / Self Care | Payer: 59 | Source: Ambulatory Visit | Attending: Radiation Oncology | Admitting: Radiation Oncology

## 2018-04-14 DIAGNOSIS — C50212 Malignant neoplasm of upper-inner quadrant of left female breast: Secondary | ICD-10-CM | POA: Diagnosis not present

## 2018-04-19 NOTE — Progress Notes (Signed)
FMLA successfully faxed to [employer] at [phone]. Mailed copy to patient address on file.

## 2018-04-21 ENCOUNTER — Encounter: Payer: Self-pay | Admitting: Radiation Oncology

## 2018-04-21 NOTE — Progress Notes (Signed)
  Radiation Oncology         [phone] ________________________________  Name: Carly Farmer MRN: [id number]  Date: 04/21/2018  DOB: 1960/03/24  End of Treatment Note  Diagnosis:   Malignant neoplasm of upper-inner quadrant of left breast in female, estrogen receptor positive ([place]). StageIA,pT1c, pN0,LeftBreast, UIQ, Invasive Ductal Carcinoma,ER (+), PR(+), HER2(neg), grade2     Indication for treatment:  Curative       Radiation treatment dates:   02/23/18-04/14/18  Site/dose:   1. Left breast; 50.46 Gy in 28 fractions of 1.8 Gy           2. Boost; 12 Gy in 6 fractions of 2 Gy  Beams/energy:   1. 3D Photon; 6X, 10X        2. 3D Photon; 12E  Narrative: The patient tolerated radiation treatment relatively well.     At the beginning of treatment, pt reported mild fatigue. Towards the end of treatment, pt reported severe fatigue and occasional sharp shooting pains throughout the breast. She reported a "rawness" and "bruised feeling" in her axilla. Pt developed radiation related skin changes, including hyperpigmentation, erythema, nipple peeling, and open skin under the breast. Pt was prescribed Radiaplex for her skin hyperpigmentation and neosporin for areas of skin breakdown  reported using it as directed.    Plan: The patient has completed radiation treatment. The patient will return to radiation oncology clinic for routine followup in one month. I advised them to call or return sooner if they have any questions or concerns related to their recovery or treatment.  -----------------------------------  Blair Promise, PhD, MD  This document serves as a record of services personally performed by Gery Pray, MD. It was created on his behalf by Mary-Margaret Loma Messing, a trained medical scribe. The creation of this record is based on the scribe's personal observations and the provider's statements to them. This document has been checked and approved by the attending provider.

## 2018-05-05 NOTE — Progress Notes (Signed)
Additional information for FMLA successfully faxed to [employer] at [phone].

## 2018-05-20 ENCOUNTER — Other Ambulatory Visit: Payer: Self-pay

## 2018-05-20 ENCOUNTER — Encounter: Payer: Self-pay | Admitting: Radiation Oncology

## 2018-05-20 ENCOUNTER — Ambulatory Visit
Admission: RE | Admit: 2018-05-20 | Discharge: 2018-05-20 | Disposition: A | Discharge status: Home / Self Care | Payer: 59 | Source: Ambulatory Visit | Attending: Radiation Oncology | Admitting: Radiation Oncology

## 2018-05-20 VITALS — BP 146/85 | HR 61 | Temp 98.0°F | Resp 20 | Wt 231.2 lb

## 2018-05-20 DIAGNOSIS — C50212 Malignant neoplasm of upper-inner quadrant of left female breast: Secondary | ICD-10-CM

## 2018-05-20 DIAGNOSIS — Z923 Personal history of irradiation: Secondary | ICD-10-CM | POA: Diagnosis not present

## 2018-05-20 DIAGNOSIS — Z79899 Other long term (current) drug therapy: Secondary | ICD-10-CM | POA: Diagnosis not present

## 2018-05-20 DIAGNOSIS — Z170 Estrogen receptor positive status [ER+]: Secondary | ICD-10-CM | POA: Insufficient documentation

## 2018-05-20 DIAGNOSIS — Z79811 Long term (current) use of aromatase inhibitors: Secondary | ICD-10-CM | POA: Insufficient documentation

## 2018-05-20 DIAGNOSIS — R5383 Other fatigue: Secondary | ICD-10-CM | POA: Insufficient documentation

## 2018-05-20 NOTE — Progress Notes (Signed)
Pt presents today for f/u with Dr. Sondra Come. Pt reports fatigue has not improved since radiation. Pt is not putting any cream/lotion on breast. Pt denies c/o pain in breast. Pt is taking Anastrozole. Breast is hyperpigmented and swollen.   BP (!) 146/85 (BP Location: Right Arm, Patient Position: Sitting)   Pulse 61   Temp 98 F (36.7 C) (Oral)   Resp 20   Wt 231 lb 3.2 oz (104.9 kg)   SpO2 100%   BMI 36.21 kg/m   Wt Readings from Last 3 Encounters:  05/20/18 231 lb 3.2 oz (104.9 kg)  03/31/18 230 lb 14.4 oz (104.7 kg)  03/26/18 228 lb (103.4 kg)   Loma Sousa, RN BSN

## 2018-05-20 NOTE — Progress Notes (Signed)
  Radiation Oncology         [phone] ________________________________  Name: Carly Farmer MRN: [id number]  Date: 05/20/2018  DOB: Jan 06, 1961  Follow-Up Visit Note  CC: Jaclynn Major, NP  Nicholas Lose, MD    ICD-10-CM   1. Malignant neoplasm of upper-inner quadrant of left breast in female, estrogen receptor positive [facility]) C50.212    Z17.0     Diagnosis:   StageIA(pT1c, pN0)LeftBreast, UIQ, Invasive Ductal Carcinoma,ER+ Ysidro Evert /HER2-, grade2  Interval Since Last Radiation:  5 weeks and 1 day 02/23/2018 - 04/14/2018:  Left Breast (+boost) / 50.4 Gy in 28 fractions (+12 Gy in 6 fractions)  Narrative:  The patient returns today for routine follow-up after completion of radiation therapy to the left breast on 04/14/2018. She continues on anastrozole under Dr. Lindi Adie.  She reports her fatigue has not improved since completing radiation therapy, but she continues working 12 hour shifts which are strenous. She denies using any cream or lotion on her breast, which our nurse notes is hyperpigmented and swollen. She denies any pain to the breast.    ALLERGIES:  is allergic to cabbage and codeine.  Meds: Current Outpatient Medications  Medication Sig Dispense Refill  . anastrozole (ARIMIDEX) 1 MG tablet Take 1 tablet (1 mg total) by mouth daily. 90 tablet 3  . sertraline (ZOLOFT) 50 MG tablet 50 mg.    . guaiFENesin (MUCINEX) 600 MG 12 hr tablet Take 600 mg by mouth 2 (two) times daily.    . phentermine 37.5 MG capsule Take 37.5 mg by mouth every morning.     No current facility-administered medications for this encounter.     Physical Findings: The patient is in no acute distress. Patient is alert and oriented.  weight is 231 lb 3.2 oz (104.9 kg). Her oral temperature is 98 F (36.7 C). Her blood pressure is 146/85 (abnormal) and her pulse is 61. Her respiration is 20 and oxygen saturation is 100%. .  No significant changes. Lungs are clear to auscultation bilaterally.  Heart has regular rate and rhythm. No palpable cervical, supraclavicular, or axillary adenopathy. Abdomen soft, non-tender, normal bowel sounds. Right Breast: no palpable mass, nipple discharge or bleeding. Left Breast: no palpable mass nipple discharge or bleeding. The patient's skin is well healed. Some hyperpigmentation changes remaining. Some edema noted  Lab Findings: Lab Results  Component Value Date   WBC 10.9 (H) 03/26/2018   HGB 12.6 03/26/2018   HCT 39.5 03/26/2018   MCV 92.9 03/26/2018   PLT 202 03/26/2018    Radiographic Findings: No results found.  Impression:  StageIA(pT1c, pN0)LeftBreast, UIQ, Invasive Ductal Carcinoma,ER+ /PR+ /HER2-, grade 2 The patient is recovering from the effects of radiation.  No evidence of recurrence on clinical exam.  Plan:  Continue follow up with medical oncology. PRN follow up in radiation oncology.  -----------------------------------  Blair Promise, PhD, MD  This document serves as a record of services personally performed by Gery Pray, MD. It was created on his behalf by Wilburn Mylar, a trained medical scribe. The creation of this record is based on the scribe's personal observations and the provider's statements to them. This document has been checked and approved by the attending provider.

## 2018-07-27 ENCOUNTER — Telehealth: Payer: Self-pay | Admitting: Adult Health

## 2018-07-27 NOTE — Telephone Encounter (Signed)
Called regarding upcoming Webex appointment, patient would prefer this be scheduled on her day off. New appointment is on 05/21 and e-mail has been sent.

## 2018-07-27 NOTE — Telephone Encounter (Signed)
Left vm for pt to call back the scheduling dept to try and convert office visit for 08/02/18 into a Webex mtg.

## 2018-08-02 ENCOUNTER — Encounter: Payer: 59 | Admitting: Adult Health

## 2018-08-05 ENCOUNTER — Encounter: Payer: Self-pay | Admitting: Adult Health

## 2018-08-05 ENCOUNTER — Inpatient Hospital Stay: Payer: 59 | Attending: Adult Health | Admitting: Adult Health

## 2018-08-05 DIAGNOSIS — Z170 Estrogen receptor positive status [ER+]: Secondary | ICD-10-CM | POA: Diagnosis not present

## 2018-08-05 DIAGNOSIS — Z923 Personal history of irradiation: Secondary | ICD-10-CM | POA: Diagnosis not present

## 2018-08-05 DIAGNOSIS — Z79811 Long term (current) use of aromatase inhibitors: Secondary | ICD-10-CM

## 2018-08-05 DIAGNOSIS — C50212 Malignant neoplasm of upper-inner quadrant of left female breast: Secondary | ICD-10-CM | POA: Diagnosis not present

## 2018-08-05 DIAGNOSIS — E2839 Other primary ovarian failure: Secondary | ICD-10-CM | POA: Diagnosis not present

## 2018-08-05 DIAGNOSIS — Z79899 Other long term (current) drug therapy: Secondary | ICD-10-CM

## 2018-08-05 NOTE — Progress Notes (Signed)
SURVIVORSHIP VIRTUAL VISIT:  I connected with Carly Farmer on 08/05/18 at  2:00 PM EDT by [facility] and verified that I am speaking with the correct person using two identifiers.   I discussed the limitations, risks, security and privacy concerns of performing an evaluation and management service by telephone/webex and the availability of in person appointments. I also discussed with the patient that there may be a patient responsible charge related to this service. The patient expressed understanding and agreed to proceed.   BRIEF ONCOLOGIC HISTORY:    Malignant neoplasm of upper-inner quadrant of left breast in female, estrogen receptor positive ([place])   12/18/2017 Initial Diagnosis    Screening mammogram detected left breast distortion at 11 o'clock position measured 8 mm by ultrasound.  Axilla negative.  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  T1bN0 stage Ia    01/19/2018 Surgery    Left lumpectomy: IDC with DCIS, 1.2 cm, grade 2, margins negative, focal lymphovascular space invasion by tumor, 0/1 lymph node negative, additional superomedial margin benign, ER 100%, PR 90%, HER-2 negative, Ki-67 12%, T1CN0 stage Ia    01/19/2018 Oncotype testing    15/4%    02/23/2018 - 04/14/2018 Radiation Therapy    Adjuvant XRT  Site/dose:   1. Left breast; 50.46 Gy in 28 fractions of 1.8 Gy.  2. Boost; 12 Gy in 6 fractions of 2 Gy    05/06/2018 -  Anti-estrogen oral therapy    Anastrozole daily     INTERVAL HISTORY:  Carly Farmer to review her survivorship care plan detailing her treatment course for breast cancer, as well as monitoring long-term side effects of that treatment, education regarding health maintenance, screening, and overall wellness and health promotion.     Overall, Carly Farmer reports feeling quite well,  She is taking the Anastrozole daily.  She notes that she is having night sweats, but nothing else.  She denies any joint aches  and pains that are increased since starting the anastrozole and denies vaginal dryness.  She does still have intermittent surgical pain like her nipple is being pulled inward.  She otherwise is feeling ok.  She was an active member at [employer], but hasn't intentionally exercised with COVID19 precautions.  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    REVIEW OF SYSTEMS:  Review of Systems  Constitutional: Negative for appetite change, chills, fever and unexpected weight change.  HENT:   Negative for hearing loss, lump/mass, sore throat and trouble swallowing.   Eyes: Negative for eye problems and icterus.  Respiratory: Negative for chest tightness, cough, shortness of breath and wheezing.   Cardiovascular: Negative for chest pain, leg swelling and palpitations.  Gastrointestinal: Negative for abdominal distention, abdominal pain, constipation, diarrhea, nausea and vomiting.  Endocrine: Negative for hot flashes.  Genitourinary: Negative for difficulty urinating.   Musculoskeletal: Negative for arthralgias.  Skin: Negative for itching and rash.  Neurological: Negative for dizziness, extremity weakness, headaches and numbness.  Hematological: Negative for adenopathy. Does not bruise/bleed easily.  Psychiatric/Behavioral: Negative for depression. The patient is not nervous/anxious.   Breast: Denies any new nodularity, masses, tenderness, nipple changes, or nipple discharge.      ONCOLOGY TREATMENT TEAM:  1. Surgeon:  Dr. Excell Seltzer at [facility] 2. Medical Oncologist: Dr. Lindi Adie  3. Radiation Oncologist: Dr. Sondra Come    PAST MEDICAL/SURGICAL HISTORY:  Past Medical History:  Diagnosis Date  . Anxiety   . Arthritis    hips and lt knee  . Asthma    adult onset  . Cancer ([place]) 12/2017   lef breast cancer  . Depression   . High cholesterol    Past Surgical History:  Procedure Laterality Date  . BREAST LUMPECTOMY WITH  RADIOACTIVE SEED AND SENTINEL LYMPH NODE BIOPSY Left 01/19/2018   Procedure: BREAST LUMPECTOMY WITH RADIOACTIVE SEED AND SENTINEL LYMPH NODE BIOPSY;  Surgeon: Excell Seltzer, MD;  Location: [place];  Service: General;  Laterality: Left;  . CHOLECYSTECTOMY    . TUBAL LIGATION    . wisdom teeth removal       ALLERGIES:  Allergies  Allergen Reactions  . Cabbage Hives  . Codeine Hives     CURRENT MEDICATIONS:  Outpatient Encounter Medications as of 08/05/2018  Medication Sig  . anastrozole (ARIMIDEX) 1 MG tablet Take 1 tablet (1 mg total) by mouth daily.  Marland Kitchen guaiFENesin (MUCINEX) 600 MG 12 hr tablet Take 600 mg by mouth 2 (two) times daily.  . phentermine 37.5 MG capsule Take 37.5 mg by mouth every morning.  . sertraline (ZOLOFT) 50 MG tablet 50 mg.   No facility-administered encounter medications on file as of 08/05/2018.      ONCOLOGIC FAMILY HISTORY:  Family History  Problem Relation Age of Onset  . Prostate cancer Father   . Bone cancer Father   . Breast cancer Maternal Aunt      GENETIC COUNSELING/TESTING: Not at this time  SOCIAL HISTORY:  Social History   Socioeconomic History  . Marital status: Married    Spouse name: Not on file  . Number of children: Not on file  . Years of education: Not on file  . Highest education level: Not on file  Occupational History  . Not on file  Social Needs  . Financial resource strain: Not on file  . Food insecurity:    Worry: Not on file    Inability: Not on file  . Transportation needs:    Medical: Not on file    Non-medical: Not on file  Tobacco Use  . Smoking status: Never Smoker  . Smokeless tobacco: Never Used  Substance and Sexual Activity  . Alcohol use: Yes    Comment: occ  . Drug use: Never  . Sexual activity: Not on file    Comment: BTL  Lifestyle  . Physical activity:    Days per week: Not on file    Minutes per session: Not on file  . Stress: Not on file  Relationships  .  Social connections:    Talks on phone: Not on file    Gets together: Not on file    Attends religious service: Not on file    Active member of club or organization: Not on file    Attends meetings of clubs or organizations: Not on file    Relationship status: Not on file  . Intimate partner violence:    Fear of current or ex partner: Not on file    Emotionally abused: Not on file    Physically abused: Not on file    Forced sexual activity: Not on file  Other Topics Concern  . Not on file  Social History Narrative  . Not on file     OBSERVATIONS/OBJECTIVE:  [place] wouldn't load her video, but her speech was normal, non pressured, she was in no apparent distress, mood and behavior were normal, breathing non labored and neuro  was focally intact  LABORATORY DATA:  None for this visit.  DIAGNOSTIC IMAGING:  None for this visit.      ASSESSMENT AND PLAN:  Ms.. Fassler is a pleasant 58 y.o. female with Stage IA left breast invasive ductal carcinoma, ER+/PR+/HER2-, diagnosed in 12/2017, treated with lumpectomy, adjuvant radiation therapy, and anti-estrogen therapy with Anastrozole beginning in 04/2018.  She presents to the Survivorship Clinic for our initial meeting and routine follow-up post-completion of treatment for breast cancer.    1. Stage IA left breast cancer:  Ms. Delafuente is continuing to recover from definitive treatment for breast cancer. She will follow-up with her medical oncologist, Dr. Lindi Adie in 12/2018 with history and physical exam per surveillance protocol.  She will continue her anti-estrogen therapy with Anastrozole. Thus far, she is tolerating the Anastrozole well, with minimal side effects. Her mammogram is due 11/2017; orders placed today.  Her breast density is category B. Today, a comprehensive survivorship care plan and treatment summary was reviewed with the patient today detailing her breast cancer diagnosis, treatment course, potential late/long-term effects of  treatment, appropriate follow-up care with recommendations for the future, and patient education resources.  A copy of this summary, along with a letter will be sent to the patient's primary care provider via mail/fax/In Basket message after today's visit.    2. Breast swelling: Recommended she come in for evaluation.  She declined and then I suggested consideration of PT, which she also declined.  She notes that she is not taking time off of work for this and she is just dealing with the pain and will continue to live with it.  I recommended she massage with vitamin e oil and to call us if it persists and continues so we can evaluate her.    3. Bone health:  Given Ms. Canedo's age/history of breast cancer and her current treatment regimen including anti-estrogen therapy with Anastrozole, she is at risk for bone demineralization.  She has not undergone bone density testing, and I ordered this for her to be done with her mammogram appointment in 11/2018.  In the meantime, she was encouraged to increase her consumption of foods rich in calcium, as well as increase her weight-bearing activities.  She was given education on specific activities to promote bone health.  4. Cancer screening:  Due to Ms. Dittmer's history and her age, she should receive screening for skin cancers, colon cancer, and gynecologic cancers.  The information and recommendations are listed on the patient's comprehensive care plan/treatment summary and were reviewed in detail with the patient.    5. Health maintenance and wellness promotion: Ms. Dellis was encouraged to consume 5-7 servings of fruits and vegetables per day. We reviewed the "Nutrition Rainbow" handout, as well as the handout "Take Control of Your Health and Reduce Your Cancer Risk" from the [employer].  She was also encouraged to engage in moderate to vigorous exercise for 30 minutes per day most days of the week. We discussed the [employer] fitness program, which  is designed for cancer survivors to help them become more physically fit after cancer treatments.  She was instructed to limit her alcohol consumption and continue to abstain from tobacco use.     6. Support services/counseling: It is not uncommon for this period of the patient's cancer care trajectory to be one of many emotions and stressors.  We discussed how this can be increasingly difficult during the times of quarantine and social distancing due to the COVID-19 pandemic.  She was given information regarding our available services and encouraged to contact me with any questions or for help enrolling in any of our support group/programs.    Follow up instructions:    -Return to cancer center for f/u with [facility] in 12/2018 -Mammogram due in 11/2018 -Bone density 11/2018 -She is welcome to return back to the Survivorship Clinic at any time; no additional follow-up needed at this time.  -Consider referral back to survivorship as a long-term survivor for continued surveillance  The patient was provided an opportunity to ask questions and all were answered. The patient agreed with the plan and demonstrated an understanding of the instructions.   The patient was advised to call back or seek an in-person evaluation if the symptoms worsen or if the condition fails to improve as anticipated.   I provided 30 minutes of face-to-face video visit time during this encounter, and > 50% was spent counseling as documented under my assessment & plan.  Scot Dock, NP

## 2018-08-06 ENCOUNTER — Telehealth: Payer: Self-pay | Admitting: Hematology and Oncology

## 2018-08-06 NOTE — Telephone Encounter (Signed)
Tried to reach regarding schedule °

## 2019-01-05 NOTE — Progress Notes (Signed)
Patient Care Team: Jaclynn Major, NP as PCP - General (Nurse Practitioner) Excell Seltzer, MD as Consulting Physician (General Surgery) Nicholas Lose, MD as Consulting Physician (Hematology and Oncology) Gery Pray, MD as Consulting Physician (Radiation Oncology)  DIAGNOSIS:    ICD-10-CM   1. Malignant neoplasm of upper-inner quadrant of left breast in female, estrogen receptor positive ([place])  C50.212    Z17.0     SUMMARY OF ONCOLOGIC HISTORY: Oncology History  Malignant neoplasm of upper-inner quadrant of left breast in female, estrogen receptor positive ([place])  12/18/2017 Initial Diagnosis   Screening mammogram detected left breast distortion at 11 o'clock position measured 8 mm by ultrasound.  Axilla negative.  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  T1bN0 stage Ia   01/19/2018 Surgery   Left lumpectomy: IDC with DCIS, 1.2 cm, grade 2, margins negative, focal lymphovascular space invasion by tumor, 0/1 lymph node negative, additional superomedial margin benign, ER 100%, PR 90%, HER-2 negative, Ki-67 12%, T1CN0 stage Ia   01/19/2018 Oncotype testing   15/4%   02/23/2018 - 04/14/2018 Radiation Therapy   Adjuvant XRT  Site/dose:   1. Left breast; 50.46 Gy in 28 fractions of 1.8 Gy.  2. Boost; 12 Gy in 6 fractions of 2 Gy   05/06/2018 -  Anti-estrogen oral therapy   Anastrozole daily, plan 5-7 years     CHIEF COMPLIANT: Follow-up of left breast cancer on anastrozole  INTERVAL HISTORY: Carly Farmer is a 58 y.o. with above-mentioned history of left breast cancer who underwent a lumpectomy, radiation, and is currently on anti-estrogen therapy with anastrozole. She presents to the clinic today for follow-up.  She reports intolerance to anastrozole therapy because of severe leg cramps as well as a dry cough.  She has been very inconsistent in taking it.  She reports that when she does not take it the symptoms  go away.  She is unable to start because of these cramps.  REVIEW OF SYSTEMS:   Constitutional: Denies fevers, chills or abnormal weight loss Eyes: Denies blurriness of vision Ears, nose, mouth, throat, and face: Denies mucositis or sore throat Respiratory: Denies cough, dyspnea or wheezes Cardiovascular: Denies palpitation, chest discomfort Gastrointestinal: Denies nausea, heartburn or change in bowel habits Skin: Denies abnormal skin rashes Lymphatics: Denies new lymphadenopathy or easy bruising Neurological: Denies numbness, tingling or new weaknesses Behavioral/Psych: Mood is stable, no new changes  Extremities: Complaining of severe cramps in the legs Breast: denies any pain or lumps or nodules in either breasts All other systems were reviewed with the patient and are negative.  I have reviewed the past medical history, past surgical history, social history and family history with the patient and they are unchanged from previous note.  ALLERGIES:  is allergic to cabbage and codeine.  MEDICATIONS:  Current Outpatient Medications  Medication Sig Dispense Refill  . anastrozole (ARIMIDEX) 1 MG tablet Take 1 tablet (1 mg total) by mouth daily. 90 tablet 3  . guaiFENesin (MUCINEX) 600 MG 12 hr tablet Take 600 mg by mouth 2 (two) times daily.    . phentermine 37.5 MG capsule Take 37.5 mg by mouth every morning.    . sertraline (ZOLOFT) 50 MG tablet 50 mg.     No current facility-administered medications for this visit.     PHYSICAL EXAMINATION: ECOG PERFORMANCE STATUS: 1 - Symptomatic but completely ambulatory  There were no vitals filed for this visit. There were no vitals  filed for this visit.  GENERAL: alert, no distress and comfortable SKIN: skin color, texture, turgor are normal, no rashes or significant lesions EYES: normal, Conjunctiva are pink and non-injected, sclera clear OROPHARYNX: no exudate, no erythema and lips, buccal mucosa, and tongue normal  NECK: supple,  thyroid normal size, non-tender, without nodularity LYMPH: no palpable lymphadenopathy in the cervical, axillary or inguinal LUNGS: clear to auscultation and percussion with normal breathing effort HEART: regular rate & rhythm and no murmurs and no lower extremity edema ABDOMEN: abdomen soft, non-tender and normal bowel sounds MUSCULOSKELETAL: no cyanosis of digits and no clubbing  NEURO: alert & oriented x 3 with fluent speech, no focal motor/sensory deficits EXTREMITIES: No lower extremity edema  LABORATORY DATA:  I have reviewed the data as listed CMP Latest Ref Rng & Units 03/26/2018 12/30/2017  Glucose 70 - 99 mg/dL 112(H) 110(H)  BUN 6 - 20 mg/dL 13 12  Creatinine 0.44 - 1.00 mg/dL 0.90 1.07(H)  Sodium 135 - 145 mmol/L 140 142  Potassium 3.5 - 5.1 mmol/L 3.5 3.7  Chloride 98 - 111 mmol/L 107 106  CO2 22 - 32 mmol/L 24 27  Calcium 8.9 - 10.3 mg/dL 8.7(L) 9.2  Total Protein 6.5 - 8.1 g/dL 6.8 6.5  Total Bilirubin 0.3 - 1.2 mg/dL 0.7 0.5  Alkaline Phos 38 - 126 U/L 81 75  AST 15 - 41 U/L 14(L) 12(L)  ALT 0 - 44 U/L 13 14    Lab Results  Component Value Date   WBC 10.9 (H) 03/26/2018   HGB 12.6 03/26/2018   HCT 39.5 03/26/2018   MCV 92.9 03/26/2018   PLT 202 03/26/2018   NEUTROABS 8.1 (H) 03/26/2018    ASSESSMENT & PLAN:  Malignant neoplasm of upper-inner quadrant of left breast in female, estrogen receptor positive ([facility]) 01/19/2018:Left lumpectomy: IDC with DCIS, 1.2 cm, grade 2, margins negative, focal lymphovascular space invasion by tumor, 0/1 lymph node negative, additional superomedial margin benign, ER 100%, PR 90%, HER-2 negative, Ki-67 12%, T1CN0 stage Ia Oncotype 15: 4% ROR Adj RT 02/23/18- 04/14/18   Plan: Adj anti estrogen therapy with Anastrozole 1 mg daily X 5-7 years started 05/06/2018, switched to letrozole 01/16/2019 Anastrozole Toxicities: Severe cramps in the legs along with dry cough. We discontinued anastrozole. On 01/16/2019 she will start half a  tablet of letrozole and she will increase it to full tablet if she tolerates it well.  Breast cancer surveillance: 1.  Breast exam 01/06/2019: Benign 2.  Mammogram has been ordered.  Telephone visit in 3 months to assess tolerability to letrozole therapy.        No orders of the defined types were placed in this encounter.  The patient has a good understanding of the overall plan. she agrees with it. she will call with any problems that may develop before the next visit here.  Nicholas Lose, MD 01/06/2019  Julious Oka Dorshimer am acting as scribe for Dr. Nicholas Lose.  I have reviewed the above documentation for accuracy and completeness, and I agree with the above.

## 2019-01-06 ENCOUNTER — Other Ambulatory Visit: Payer: Self-pay

## 2019-01-06 ENCOUNTER — Inpatient Hospital Stay: Payer: 59 | Attending: Hematology and Oncology | Admitting: Hematology and Oncology

## 2019-01-06 DIAGNOSIS — Z79811 Long term (current) use of aromatase inhibitors: Secondary | ICD-10-CM | POA: Diagnosis not present

## 2019-01-06 DIAGNOSIS — Z170 Estrogen receptor positive status [ER+]: Secondary | ICD-10-CM | POA: Diagnosis not present

## 2019-01-06 DIAGNOSIS — C50212 Malignant neoplasm of upper-inner quadrant of left female breast: Secondary | ICD-10-CM | POA: Diagnosis present

## 2019-01-06 DIAGNOSIS — Z923 Personal history of irradiation: Secondary | ICD-10-CM | POA: Diagnosis not present

## 2019-01-06 MED ORDER — LETROZOLE 2.5 MG PO TABS: 2.5000 mg | ORAL_TABLET | Freq: Every day | ORAL | 3 refills | Status: DC

## 2019-01-06 NOTE — Assessment & Plan Note (Signed)
01/19/2018:Left lumpectomy: IDC with DCIS, 1.2 cm, grade 2, margins negative, focal lymphovascular space invasion by tumor, 0/1 lymph node negative, additional superomedial margin benign, ER 100%, PR 90%, HER-2 negative, Ki-67 12%, T1CN0 stage Ia Oncotype 15: 4% ROR Adj RT 02/23/18- 04/14/18   Plan: Adj anti estrogen therapy with Anastrozole 1 mg daily X 5-7 years started 05/06/2018 Anastrozole Toxicities:  Breast cancer surveillance: 1.  Breast exam 01/06/2019: Benign 2.  Mammogram has been ordered.  Return to clinic in 1 year for follow-up

## 2019-01-07 ENCOUNTER — Telehealth: Payer: Self-pay | Admitting: Hematology and Oncology

## 2019-01-07 NOTE — Telephone Encounter (Signed)
I left a message regarding schedule  

## 2019-04-07 ENCOUNTER — Telehealth: Payer: Self-pay | Admitting: Hematology and Oncology

## 2019-04-07 NOTE — Progress Notes (Signed)
Patient did not answer her telephone after trying her multiple times.  Her mobile phone does not accept voicemails. I also called and left a voicemail at her husband's phone. If she calls she can be rescheduled. This encounter was created in error - please disregard. This encounter was created in error - please disregard.

## 2019-04-07 NOTE — Telephone Encounter (Signed)
Returned patient's phone call regarding rescheduling an appointment, voicemail is full.

## 2019-04-08 ENCOUNTER — Inpatient Hospital Stay: Payer: 59 | Attending: Hematology and Oncology | Admitting: Hematology and Oncology

## 2019-04-08 DIAGNOSIS — Z170 Estrogen receptor positive status [ER+]: Secondary | ICD-10-CM

## 2019-04-08 DIAGNOSIS — C50212 Malignant neoplasm of upper-inner quadrant of left female breast: Secondary | ICD-10-CM

## 2019-04-08 NOTE — Assessment & Plan Note (Signed)
01/19/2018:Left lumpectomy: IDC with DCIS, 1.2 cm, grade 2, margins negative, focal lymphovascular space invasion by tumor, 0/1 lymph node negative, additional superomedial margin benign, ER 100%, PR 90%, HER-2 negative, Ki-67 12%, T1CN0 stage Ia Oncotype 15: 4% ROR Adj RT12/10/19- 04/14/18  Plan:Adj anti estrogen therapy with Anastrozole 1 mg daily X 5-7 years started 05/06/2018, switched to letrozole 01/16/2019  Letrozole Toxicities:   Breast cancer surveillance: 1.  Breast exam 04/08/2019: Benign 2.  Mammogram has been ordered.  Patient has not had a mammogram in 2020.  Return to clinic in 6 months for follow-up

## 2019-04-14 ENCOUNTER — Telehealth: Payer: Self-pay | Admitting: Hematology and Oncology

## 2019-04-14 NOTE — Telephone Encounter (Signed)
Scheduled apt per 1/22 sch message - pt aware of new appt date and time

## 2019-04-22 ENCOUNTER — Inpatient Hospital Stay: Payer: Self-pay | Attending: Hematology and Oncology | Admitting: Hematology and Oncology

## 2019-04-22 NOTE — Assessment & Plan Note (Deleted)
01/19/2018:Left lumpectomy: IDC with DCIS, 1.2 cm, grade 2, margins negative, focal lymphovascular space invasion by tumor, 0/1 lymph node negative, additional superomedial margin benign, ER 100%, PR 90%, HER-2 negative, Ki-67 12%, T1CN0 stage Ia Oncotype 15: 4% ROR Adj RT12/10/19- 04/14/18  Plan:Adj anti estrogen therapy with Anastrozole 1 mg daily X 5-7 years started 05/06/2018, switched to letrozole 01/16/2019 due toSevere cramps in the legs along with dry cough  Letrozole Toxicities: . On 01/16/2019 she will start half a tablet of letrozole and she will increase it to full tablet if she tolerates it well.  Breast cancer surveillance: 1.  Breast exam 01/06/2019: Benign 2.  Mammogram has been ordered.  Telephone visit in 1 year for follow-up

## 2019-08-21 DIAGNOSIS — Z20828 Contact with and (suspected) exposure to other viral communicable diseases: Secondary | ICD-10-CM | POA: Diagnosis not present

## 2019-08-21 DIAGNOSIS — J069 Acute upper respiratory infection, unspecified: Secondary | ICD-10-CM | POA: Diagnosis not present

## 2019-08-21 DIAGNOSIS — R05 Cough: Secondary | ICD-10-CM | POA: Diagnosis not present

## 2019-08-21 DIAGNOSIS — R0602 Shortness of breath: Secondary | ICD-10-CM | POA: Diagnosis not present

## 2019-09-01 DIAGNOSIS — F411 Generalized anxiety disorder: Secondary | ICD-10-CM | POA: Diagnosis not present

## 2019-09-01 DIAGNOSIS — M79671 Pain in right foot: Secondary | ICD-10-CM | POA: Diagnosis not present

## 2019-09-01 DIAGNOSIS — E669 Obesity, unspecified: Secondary | ICD-10-CM | POA: Diagnosis not present

## 2019-09-01 DIAGNOSIS — R030 Elevated blood-pressure reading, without diagnosis of hypertension: Secondary | ICD-10-CM | POA: Diagnosis not present

## 2019-09-01 DIAGNOSIS — F329 Major depressive disorder, single episode, unspecified: Secondary | ICD-10-CM | POA: Diagnosis not present

## 2019-09-01 DIAGNOSIS — R202 Paresthesia of skin: Secondary | ICD-10-CM | POA: Diagnosis not present

## 2019-09-01 DIAGNOSIS — R7303 Prediabetes: Secondary | ICD-10-CM | POA: Diagnosis not present

## 2019-09-01 DIAGNOSIS — R200 Anesthesia of skin: Secondary | ICD-10-CM | POA: Diagnosis not present

## 2019-09-11 DIAGNOSIS — N3090 Cystitis, unspecified without hematuria: Secondary | ICD-10-CM | POA: Diagnosis not present

## 2019-09-11 DIAGNOSIS — R5383 Other fatigue: Secondary | ICD-10-CM | POA: Diagnosis not present

## 2019-09-11 DIAGNOSIS — R42 Dizziness and giddiness: Secondary | ICD-10-CM | POA: Diagnosis not present

## 2019-09-11 DIAGNOSIS — N3001 Acute cystitis with hematuria: Secondary | ICD-10-CM | POA: Diagnosis not present

## 2019-09-11 DIAGNOSIS — R531 Weakness: Secondary | ICD-10-CM | POA: Diagnosis not present

## 2019-11-26 ENCOUNTER — Telehealth: Payer: Self-pay | Admitting: Unknown Physician Specialty

## 2019-11-26 ENCOUNTER — Other Ambulatory Visit: Payer: Self-pay | Admitting: Unknown Physician Specialty

## 2019-11-26 DIAGNOSIS — C50212 Malignant neoplasm of upper-inner quadrant of left female breast: Secondary | ICD-10-CM

## 2019-11-26 DIAGNOSIS — Z170 Estrogen receptor positive status [ER+]: Secondary | ICD-10-CM

## 2019-11-26 DIAGNOSIS — U071 COVID-19: Secondary | ICD-10-CM

## 2019-11-26 DIAGNOSIS — E663 Overweight: Secondary | ICD-10-CM

## 2019-11-26 NOTE — Telephone Encounter (Signed)
I connected by phone with Carly Farmer on 11/26/2019 at 9:27 AM to discuss the potential use of a new treatment for mild to moderate COVID-19 viral infection in non-hospitalized patients.  This patient is a 59 y.o. female that meets the FDA criteria for Emergency Use Authorization of COVID monoclonal antibody casirivimab/imdevimab.  Has a (+) direct SARS-CoV-2 viral test result  Has mild or moderate COVID-19   Is NOT hospitalized due to COVID-19  Is within 10 days of symptom onset  Has at least one of the high risk factor(s) for progression to severe COVID-19 and/or hospitalization as defined in EUA.  Specific high risk criteria : BMI > 25   I have spoken and communicated the following to the patient or parent/caregiver regarding COVID monoclonal antibody treatment:  1. FDA has authorized the emergency use for the treatment of mild to moderate COVID-19 in adults and pediatric patients with positive results of direct SARS-CoV-2 viral testing who are 22 years of age and older weighing at least 40 kg, and who are at high risk for progressing to severe COVID-19 and/or hospitalization.  2. The significant known and potential risks and benefits of COVID monoclonal antibody, and the extent to which such potential risks and benefits are unknown.  3. Information on available alternative treatments and the risks and benefits of those alternatives, including clinical trials.  4. Patients treated with COVID monoclonal antibody should continue to self-isolate and use infection control measures (e.g., wear mask, isolate, social distance, avoid sharing personal items, clean and disinfect high touch surfaces, and frequent handwashing) according to CDC guidelines.   5. The patient or parent/caregiver has the option to accept or refuse COVID monoclonal antibody treatment.  After reviewing this information with the patient, The patient agreed to proceed with receiving casirivimab\imdevimab infusion and  will be provided a copy of the Fact sheet prior to receiving the infusion. Kathrine Haddock 11/26/2019 9:27 AM  Sx onset 9/10

## 2019-11-26 NOTE — Progress Notes (Signed)
Sx onset 9/10

## 2019-11-27 ENCOUNTER — Ambulatory Visit (HOSPITAL_COMMUNITY)
Admission: RE | Admit: 2019-11-27 | Discharge: 2019-11-27 | Disposition: A | Discharge status: Home / Self Care | Payer: BC Managed Care – PPO | Source: Ambulatory Visit | Attending: Pulmonary Disease | Admitting: Pulmonary Disease

## 2019-11-27 DIAGNOSIS — C50212 Malignant neoplasm of upper-inner quadrant of left female breast: Secondary | ICD-10-CM | POA: Diagnosis not present

## 2019-11-27 DIAGNOSIS — U071 COVID-19: Secondary | ICD-10-CM | POA: Insufficient documentation

## 2019-11-27 DIAGNOSIS — Z170 Estrogen receptor positive status [ER+]: Secondary | ICD-10-CM | POA: Insufficient documentation

## 2019-11-27 DIAGNOSIS — E663 Overweight: Secondary | ICD-10-CM | POA: Insufficient documentation

## 2019-11-27 MED ORDER — ALBUTEROL SULFATE HFA 108 (90 BASE) MCG/ACT IN AERS: 2.0000 | INHALATION_SPRAY | Freq: Once | RESPIRATORY_TRACT | Status: DC | PRN

## 2019-11-27 MED ORDER — EPINEPHRINE 0.3 MG/0.3ML IJ SOAJ: 0.3000 mg | Freq: Once | INTRAMUSCULAR | Status: DC | PRN

## 2019-11-27 MED ORDER — FAMOTIDINE IN NACL 20-0.9 MG/50ML-% IV SOLN: 20.0000 mg | Freq: Once | INTRAVENOUS | Status: DC | PRN

## 2019-11-27 MED ORDER — DIPHENHYDRAMINE HCL 50 MG/ML IJ SOLN: 50.0000 mg | Freq: Once | INTRAMUSCULAR | Status: DC | PRN

## 2019-11-27 MED ORDER — SODIUM CHLORIDE 0.9 % IV SOLN: INTRAVENOUS | Status: DC | PRN

## 2019-11-27 MED ORDER — SODIUM CHLORIDE 0.9 % IV SOLN
1200.0000 mg | Freq: Once | INTRAVENOUS | Status: AC
  Administered 2019-11-27: 1200 mg via INTRAVENOUS
  Filled 2019-11-27: qty 10

## 2019-11-27 MED ORDER — METHYLPREDNISOLONE SODIUM SUCC 125 MG IJ SOLR: 125.0000 mg | Freq: Once | INTRAMUSCULAR | Status: DC | PRN

## 2019-11-27 NOTE — Discharge Instructions (Signed)

## 2019-11-27 NOTE — Progress Notes (Signed)
  Diagnosis: COVID-19  Physician: Dr. Joya Gaskins   Procedure: [facility] Med: casirivimab\imdevimab infusion - Provided patient with casirivimab\imdevimab fact sheet for patients, parents and caregivers prior to infusion.  Complications: No immediate complications noted.  Discharge: Discharged home   Claudia Desanctis 11/27/2019

## 2020-05-09 ENCOUNTER — Telehealth: Payer: Self-pay | Admitting: Oncology

## 2020-05-09 NOTE — Telephone Encounter (Signed)
Patient requested Transfer of Care for Hx: Breast CA from Dr Loleta Dicker to Dr Hinton Rao  Patient scheduled 05/11/20 Labs 3:00 pm - Consult 3:30 pm

## 2020-05-10 ENCOUNTER — Telehealth: Payer: Self-pay | Admitting: Oncology

## 2020-05-10 NOTE — Progress Notes (Incomplete)
Carly Farmer  [address] [place],  [place]  [address] [phone]  Clinic Day:  05/10/2020  Referring physician: Jaclynn Major, NP  This document serves as a record of services personally performed by Hosie Poisson, MD. It was created on their behalf by Curry,Lauren E, a trained medical scribe. The creation of this record is based on the scribe's personal observations and the provider's statements to them.  CHIEF COMPLAINT:  CC: ***  Current Treatment:  ***   HISTORY OF PRESENT ILLNESS:  Carly Farmer is a 60 y.o. 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  This was diagnosed in October 2019 after the patient underwent a mammogram that detected a left breast distortion.  This was evaluated by ultrasound which revealed a 8 mm lesion at 11 o'clock position.  Axilla was negative.  Biopsy confirmed grade 2 invasive ductal carcinoma with DCIS that was ER 100% PR 90% Ki-67 12% and HER-2 negative.  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  One lymph node was negative for malignancy 0/1, staging this as a T1cN0 stage IA.  Adjuvant radiation was initiated in December and completed in January 2020 for a total of 50.46 Gy in 28 fractions and a boost of 12 Gy in 6 fractions.  She was then placed on hormonal therapy with anastrozole daily in February 2020 and continues this without difficulty.  INTERVAL HISTORY:  Carly Farmer ***.   Her  appetite is good, and she has gained/lost _ pounds since her last visit.  She denies fever, chills or other signs of infection.  She denies nausea, vomiting, bowel issues, or abdominal pain.  She denies sore throat, cough, dyspnea, or chest  pain.  REVIEW OF SYSTEMS:  Review of Systems - Oncology   VITALS:  There were no vitals taken for this visit.  Wt Readings from Last 3 Encounters:  01/06/19 216 lb 3.2 oz (98.1 kg)  05/20/18 231 lb 3.2 oz (104.9 kg)  03/31/18 230 lb 14.4 oz (104.7 kg)    There is no height or weight on file to calculate BMI.  Performance status (ECOG): {CHL ONC [id number]  PHYSICAL EXAM:  Physical Exam  LABS:   CBC Latest Ref Rng & Units 03/26/2018 12/30/2017  WBC 4.0 - 10.5 K/uL 10.9(H) 5.3  Hemoglobin 12.0 - 15.0 g/dL 12.6 12.9  Hematocrit 36.0 - 46.0 % 39.5 40.8  Platelets 150 - 400 K/uL 202 231   CMP Latest Ref Rng & Units 03/26/2018 12/30/2017  Glucose 70 - 99 mg/dL 112(H) 110(H)  BUN 6 - 20 mg/dL 13 12  Creatinine 0.44 - 1.00 mg/dL 0.90 1.07(H)  Sodium 135 - 145 mmol/L 140 142  Potassium 3.5 - 5.1 mmol/L 3.5 3.7  Chloride 98 - 111 mmol/L 107 106  CO2 22 - 32 mmol/L 24 27  Calcium 8.9 - 10.3 mg/dL 8.7(L) 9.2  Total Protein 6.5 - 8.1 g/dL 6.8 6.5  Total Bilirubin 0.3 - 1.2 mg/dL 0.7 0.5  Alkaline Phos 38 - 126 U/L 81 75  AST 15 - 41 U/L 14(L) 12(L)  ALT 0 - 44 U/L 13 14     STUDIES:  No results found.    HISTORY:   Past Medical History:  Diagnosis Date  .  Anxiety   . Arthritis    hips and lt knee  . Asthma    adult onset  . Cancer ([place]) 12/2017   lef breast cancer  . Depression   . High cholesterol     Past Surgical History:  Procedure Laterality Date  . BREAST LUMPECTOMY WITH RADIOACTIVE SEED AND SENTINEL LYMPH NODE BIOPSY Left 01/19/2018   Procedure: BREAST LUMPECTOMY WITH RADIOACTIVE SEED AND SENTINEL LYMPH NODE BIOPSY;  Surgeon: Excell Seltzer, MD;  Location: [facility];  Service: General;  Laterality: Left;  . CHOLECYSTECTOMY    . TUBAL LIGATION    . wisdom teeth removal      Family History  Problem Relation Age of Onset  . Prostate cancer Father   . Bone cancer Father   . Breast cancer Maternal Aunt     Social History:   reports that she has never smoked. She has never used smokeless tobacco. She reports current alcohol use. She reports that she does not use drugs.The patient is {Blank single:19197::"alone","accompanied by"} *** today.  Allergies:  Allergies  Allergen Reactions  . Cabbage Hives  . Codeine Hives    Current Medications: Current Outpatient Medications  Medication Sig Dispense Refill  . letrozole (FEMARA) 2.5 MG tablet Take 1 tablet (2.5 mg total) by mouth daily. 90 tablet 3  . phentermine 37.5 MG capsule Take 37.5 mg by mouth every morning.    . sertraline (ZOLOFT) 50 MG tablet 50 mg.     No current facility-administered medications for this visit.     ASSESSMENT & PLAN:   Assessment:   1.  Stage IA invasive ductal carcinoma with DCIS, diagnosed in October 2019.  Treated with lumpectomy and adjuvant radiation.  She is now on hormonal therapy with anastrozole 1 mg daily and continues this without difficulty.  Plan: This is a pleasant 60 year old female who presents to the clinic for a transfer of care for further evaluation and and management of breast cancer.  She is currently on hormonal therapy with anastrozole daily and has tolerated this without significant difficulty.  I again explained the importance of taking this for a total of 5 years.   She understands and agrees with this plan of care.  I have answered her questions and she knows to call with any concerns.  Thank you for the opportunity to participate ini the care of your patients   I provided *** minutes of face-to-face time during this this encounter and > 50% was spent counseling as documented under my assessment and plan.    Derwood Kaplan, MD [facility] AT [facility] [address] [place] [address] Dept: [phone] Dept Fax: [phone]   I, Rita Ohara, am acting as scribe for Derwood Kaplan, MD  I have reviewed this report as typed by the  medical scribe, and it is complete and accurate.  Hermina Barters

## 2020-05-10 NOTE — Telephone Encounter (Signed)
Patient rescheduled from 05/11/20 Labs, New Patient Consult to 3/25 Labs 3:30 pm - Consult 4:00 pm

## 2020-05-11 ENCOUNTER — Ambulatory Visit: Payer: BC Managed Care – PPO | Admitting: Oncology

## 2020-05-11 ENCOUNTER — Other Ambulatory Visit: Payer: BC Managed Care – PPO

## 2020-06-07 NOTE — Progress Notes (Signed)
Carly  [address] [place],  [place]  [place] [phone]  Clinic Day:  06/08/2020  Referring physician: Jaclynn Major, NP  This document serves as a record of services personally performed by Hosie Poisson, MD. It was created on their behalf by Curry,Lauren E, a trained medical scribe. The creation of this record is based on the scribe's personal observations and the provider's statements to them.  CHIEF COMPLAINT:  CC: Stage IA hormone receptor positive left breast cancer  Current Treatment:  Hormonal therapy with letrozole daily for a total of 5 years   HISTORY OF PRESENT ILLNESS:  Carly Farmer is a 60 y.o. female referred by Dr. Jaclynn Major for continued care and evaluation in a patient with a history of stage IA hormone receptor positive left breast cancer.  She was originally diagnosed in October 2019 when screening mammogram detected a distortion at 11 o'clock measuring 8 mm within the left breast.  Axilla was negative.  Prior to this, she had not had routine annual mammography for 9 years.  Biopsy revealed grade 2 invasive ductal carcinoma with DCIS.  Estrogen receptor was 100% positive, progesterone receptor was 90% positive and Ki67 was 12%.  HER2 was negative.  Lumpectomy was pursued in November and final pathology confirmed invasive ductal carcinoma with DCIS, measuring 1.2 cm, grade 2.  Margins were clear.  One sentinel lymph node was negative for malignancy (0/1) staging her as a T1CN0M0 stage IA.  Oncotype testing revealed a score of 15 and a 4% chance of recurrence within 9 years with adjuvant hormonal therapy.  She received radiation therapy from December 2019 to January 2020.  She was then started on hormonal therapy with anastrozole 1 mg daily in February 2020, but she did not tolerate this due to leg cramps, and was only on this for a short time.  She was then switched to letrozole in November 2020 but maybe never  tried this.      INTERVAL HISTORY:  Carly Farmer states that she has not has a mammogram since her lumpectomy.  She has not been seen by oncology since October 2020.  She denies any breast changes.  She did contract COVID-19 in September 2021, between her 1st and 2nd vaccine.  She states that her case was mild.   She states that she has been well, but does note moderate arthralgias especially of the left hip.  She was tested for rheumatoid arthritis and was negative.  She feels that she may have fibromyalgia.  She is on gabapentin for her sciatic nerve pain.  Her  appetite is good, and she is eating well.  She has been dieting intermittently and has lost weight.  She denies fever, chills or other signs of infection.  She denies nausea, vomiting, bowel issues, or abdominal pain.  She denies sore throat, cough, dyspnea, or chest pain.  She has never undergone colonoscopy or bone density scan.  She started menarche at 26-58 years old.  She had her 1st live birth at 37.  She went through menopause around age 45, and she did not receive hormone replacement.    REVIEW OF SYSTEMS:  Review of Systems  Constitutional: Negative.  Negative for appetite change, chills, fatigue, fever and unexpected weight change.  HENT:  Negative.   Eyes: Negative.   Respiratory: Negative.  Negative for chest tightness, cough, hemoptysis, shortness of breath and wheezing.   Cardiovascular: Negative.  Negative for chest pain, leg swelling and palpitations.  Gastrointestinal:  Negative.  Negative for abdominal distention, abdominal pain, blood in stool, constipation, diarrhea, nausea and vomiting.  Endocrine: Negative.   Genitourinary: Negative.  Negative for difficulty urinating, dysuria, frequency, hematuria and pelvic pain.   Musculoskeletal: Positive for arthralgias (significant of the left hip) and back pain (sciatic pain). Negative for flank pain, gait problem and myalgias.  Skin: Negative.   Neurological: Negative.  Negative for  dizziness, extremity weakness, gait problem, headaches, light-headedness, numbness, seizures and speech difficulty.  Hematological: Negative.   Psychiatric/Behavioral: Negative.  Negative for depression and sleep disturbance. The patient is not nervous/anxious.      VITALS:  Blood pressure (!) 154/92, pulse 71, temperature 98.5 F (36.9 C), temperature source Oral, resp. rate 18, height '5\' 7"'  (1.702 m), weight 230 lb (104.3 kg), SpO2 96 %.  Wt Readings from Last 3 Encounters:  06/08/20 230 lb (104.3 kg)  01/06/19 216 lb 3.2 oz (98.1 kg)  05/20/18 231 lb 3.2 oz (104.9 kg)    Body mass index is 36.02 kg/m.  Performance status (ECOG): 1 - Symptomatic but completely ambulatory  PHYSICAL EXAM:  Physical Exam Constitutional:      General: She is not in acute distress.    Appearance: Normal appearance. She is normal weight.  HENT:     Head: Normocephalic and atraumatic.  Eyes:     General: No scleral icterus.    Extraocular Movements: Extraocular movements intact.     Conjunctiva/sclera: Conjunctivae normal.     Pupils: Pupils are equal, round, and reactive to light.  Cardiovascular:     Rate and Rhythm: Normal rate and regular rhythm.     Pulses: Normal pulses.     Heart sounds: Normal heart sounds. No murmur heard. No friction rub. No gallop.   Pulmonary:     Effort: Pulmonary effort is normal. No respiratory distress.     Breath sounds: Normal breath sounds.  Chest:     Comments: Firm ridge at 12 o'clock in the upper right chest wall.  Right breast is without masses. She has radiation changes of the left breast.  Upper inner quadrant of the left breast has mild fibrocystic changes. Abdominal:     General: Bowel sounds are normal. There is no distension.     Palpations: Abdomen is soft. There is no hepatomegaly, splenomegaly or mass.     Tenderness: There is no abdominal tenderness.  Musculoskeletal:        General: Normal range of motion.     Cervical back: Normal range of  motion and neck supple.     Right lower leg: No edema.     Left lower leg: No edema.  Lymphadenopathy:     Cervical: No cervical adenopathy.  Skin:    General: Skin is warm and dry.  Neurological:     General: No focal deficit present.     Mental Status: She is alert and oriented to person, place, and time. Mental status is at baseline.  Psychiatric:        Mood and Affect: Mood normal.        Behavior: Behavior normal.        Thought Content: Thought content normal.        Judgment: Judgment normal.     LABS:   CBC Latest Ref Rng & Units 06/08/2020 03/26/2018 12/30/2017  WBC - 6.3 10.9(H) 5.3  Hemoglobin 12.0 - 16.0 12.5 12.6 12.9  Hematocrit 36 - 46 38 39.5 40.8  Platelets 150 - 399 245 202 231  CMP Latest Ref Rng & Units 06/08/2020 03/26/2018 12/30/2017  Glucose 70 - 99 mg/dL - 112(H) 110(H)  BUN 4 - '21 19 13 12  ' Creatinine 0.5 - 1.1 0.8 0.90 1.07(H)  Sodium 137 - 147 138 140 142  Potassium 3.4 - 5.3 4.1 3.5 3.7  Chloride 99 - 108 105 107 106  CO2 13 - 22 27(A) 24 27  Calcium 8.7 - 10.7 8.7 8.7(L) 9.2  Total Protein 6.5 - 8.1 g/dL - 6.8 6.5  Total Bilirubin 0.3 - 1.2 mg/dL - 0.7 0.5  Alkaline Phos 25 - 125 77 81 75  AST 13 - 35 21 14(L) 12(L)  ALT 7 - 35 '16 13 14    ' STUDIES:  No results found.    HISTORY:   Past Medical History:  Diagnosis Date  . Anxiety   . Arthritis    hips and lt knee  . Asthma    adult onset  . Breast cancer ([place])   . Cancer ([place]) 12/2017   lef breast cancer  . Depression   . High cholesterol     Past Surgical History:  Procedure Laterality Date  . BREAST LUMPECTOMY WITH RADIOACTIVE SEED AND SENTINEL LYMPH NODE BIOPSY Left 01/19/2018   Procedure: BREAST LUMPECTOMY WITH RADIOACTIVE SEED AND SENTINEL LYMPH NODE BIOPSY;  Surgeon: Excell Seltzer, MD;  Location: [facility];  Service: General;  Laterality: Left;  . CHOLECYSTECTOMY    . TUBAL LIGATION    . wisdom teeth removal      Family History  Problem  Relation Age of Onset  . Prostate cancer Father        late 16s  . Bone cancer Father   . Breast cancer Maternal Aunt        late 79s  . Skin cancer Sister 34       face    Social History:  reports that she has never smoked. She has never used smokeless tobacco. She reports current alcohol use. She reports that she does not use drugs.The patient is alone today.  She is married and lives at home with her spouse.  She has 2 children.  She is currently working part time Monday - Friday.    Allergies:  Allergies  Allergen Reactions  . Cabbage Hives  . Codeine Hives    Current Medications: Current Outpatient Medications  Medication Sig Dispense Refill  . gabapentin (NEURONTIN) 300 MG capsule Take 300 mg by mouth at bedtime.    . phentermine (ADIPEX-P) 37.5 MG tablet Take 37.5 mg by mouth every morning.    . sertraline (ZOLOFT) 50 MG tablet 50 mg.     No current facility-administered medications for this visit.     ASSESSMENT & PLAN:   Assessment:   1.  Stage IA (T1cN0M0) invasive ductal carcinoma and ductal carcinoma in situ of the left breast, diagnosed in October 2019.  Oncotype recurrence score was 15, indicating low risk for recurrence.  She was treated with lumpectomy followed by radiation and was then placed on hormonal therapy in February 2020 with anastrozole for a short time.  This was switched to letrozole in October 2020, but never took this.  We reviewed her diagnosis and the benefit of adjuvant hormonal therapy today, but her prognosis is quite good.   2.  Firm ridge at 12 o'clock in the right upper chest wall.  I will add an ultrasound of this area with her mammogram for further evaluation, but I do not have a high suspicion  for malignancy.    3.  Moderate arthritis, most severe of the left hip.  She has never undergone bone density screening, and so I will schedule this for her.   4.  Need for screening colonoscopy as she has never had this done. We will refer her to  Dr. Lyda Jester.    Plan: This is a pleasant 60 year old female with a history of stage IA hormone receptor positive left breast cancer in October 2019.  This was treated with lumpectomy followed by radiation therapy.  She was then placed on hormonal therapy in February 2020 with anastrozole, but did not tolerate this.  She was then switched to letrozole but never took this.  We reviewed her diagnosis, prognostic indicators and Oncotype results today.  I still recommend that she consider adjuvant hormonal therapy as this would decrease her risk for recurrence or developing a 2nd breast cancer.  We could consider letrozole, but another option would be tamoxifen.  She is in agreement to try letrozole again so I will send this in today.  Since she has never undergone baseline bone density scan, I will schedule this for her and we will plan to repeat this every 2 years, especially if she take the hormonal therapy as recommended.  Since it has been over 2 years since her last mammogram, I will schedule this for her and include right ultrasound due to the firm ridge at 12 o'clock.  She needs to undergo baseline colonoscopy so we will schedule these accordingly as above.  We will plan to see her back in 6 months for repeat examination.  She understands and agrees with this plan of care.  I have answered her questions and she knows to call with any concerns.    Thank you for the opportunity to participate in the care of your patients   I provided 50 minutes of face-to-face time during this this encounter and > 50% was spent counseling as documented under my assessment and plan.    Derwood Kaplan, MD [facility] AT [facility] [address] [place] [address] Dept: [phone] Dept Fax: [phone]   I, Rita Ohara, am acting as scribe for Derwood Kaplan, MD  I have reviewed this report as typed by the medical scribe, and it is complete  and accurate.  Hermina Barters

## 2020-06-08 ENCOUNTER — Inpatient Hospital Stay (INDEPENDENT_AMBULATORY_CARE_PROVIDER_SITE_OTHER): Payer: 59 | Admitting: Oncology

## 2020-06-08 ENCOUNTER — Encounter: Payer: Self-pay | Admitting: Oncology

## 2020-06-08 ENCOUNTER — Other Ambulatory Visit: Payer: Self-pay | Admitting: Oncology

## 2020-06-08 ENCOUNTER — Other Ambulatory Visit: Payer: Self-pay

## 2020-06-08 ENCOUNTER — Inpatient Hospital Stay: Payer: 59 | Attending: Oncology

## 2020-06-08 ENCOUNTER — Other Ambulatory Visit: Payer: Self-pay | Admitting: Hematology and Oncology

## 2020-06-08 VITALS — BP 154/92 | HR 71 | Temp 98.5°F | Resp 18 | Ht 67.0 in | Wt 230.0 lb

## 2020-06-08 DIAGNOSIS — Z170 Estrogen receptor positive status [ER+]: Secondary | ICD-10-CM

## 2020-06-08 DIAGNOSIS — C50212 Malignant neoplasm of upper-inner quadrant of left female breast: Secondary | ICD-10-CM | POA: Diagnosis not present

## 2020-06-08 LAB — HEPATIC FUNCTION PANEL
ALT: 16 (ref 7–35)
AST: 21 (ref 13–35)
Alkaline Phosphatase: 77 (ref 25–125)
Bilirubin, Total: 0.4

## 2020-06-08 LAB — BASIC METABOLIC PANEL
BUN: 19 (ref 4–21)
CO2: 27 — AB (ref 13–22)
Chloride: 105 (ref 99–108)
Creatinine: 0.8 (ref 0.5–1.1)
Glucose: 101
Potassium: 4.1 (ref 3.4–5.3)
Sodium: 138 (ref 137–147)

## 2020-06-08 LAB — CBC AND DIFFERENTIAL
HCT: 38 (ref 36–46)
Hemoglobin: 12.5 (ref 12.0–16.0)
Neutrophils Absolute: 3.34
Platelets: 245 (ref 150–399)
WBC: 6.3

## 2020-06-08 LAB — COMPREHENSIVE METABOLIC PANEL
Albumin: 4 (ref 3.5–5.0)
Calcium: 8.7 (ref 8.7–10.7)

## 2020-06-08 LAB — CBC: RBC: 4.2 (ref 3.87–5.11)

## 2020-06-14 ENCOUNTER — Other Ambulatory Visit: Payer: Self-pay | Admitting: Oncology

## 2020-06-14 DIAGNOSIS — Z170 Estrogen receptor positive status [ER+]: Secondary | ICD-10-CM

## 2020-06-14 DIAGNOSIS — C50212 Malignant neoplasm of upper-inner quadrant of left female breast: Secondary | ICD-10-CM

## 2020-06-14 MED ORDER — LETROZOLE 2.5 MG PO TABS: 2.5000 mg | ORAL_TABLET | Freq: Every day | ORAL | 5 refills | Status: DC

## 2020-06-15 ENCOUNTER — Other Ambulatory Visit: Payer: Self-pay

## 2020-06-15 ENCOUNTER — Telehealth: Payer: Self-pay

## 2020-06-15 DIAGNOSIS — C50212 Malignant neoplasm of upper-inner quadrant of left female breast: Secondary | ICD-10-CM

## 2020-06-15 DIAGNOSIS — Z170 Estrogen receptor positive status [ER+]: Secondary | ICD-10-CM

## 2020-06-15 NOTE — Telephone Encounter (Signed)
Referral faxed to Dr Misenheimers office. 

## 2020-06-15 NOTE — Telephone Encounter (Signed)
-----   Message from Derwood Kaplan, MD sent at 06/14/2020  7:17 PM EDT ----- Regarding: referral Pls ref Dr. Lyda Jester, has never had a colonoscopy

## 2020-07-16 IMAGING — US US BREAST BX W LOC DEV 1ST LESION IMG BX SPEC US GUIDE*L*
1 series · 9 of 9 positions shown · non-contrast
Comparison: Previous exam(s).

ADDENDUM:
Pathology revealed GRADE II INVASIVE DUCTAL CARCINOMA, DUCTAL
CARCINOMA IN SITU, of LEFT breast, 11 o'clock. This was found to be
concordant by Dr. Derje Tigest.

Pathology results were discussed with the patient by telephone. The
patient reported doing well after the biopsy with tenderness at the
site. Post biopsy instructions and care were reviewed and questions
were answered. The patient was encouraged to call The [REDACTED]
The patient was referred to [REDACTED]
[REDACTED] at [REDACTED] on
December 30, 2017.
Pathology results reported by Diamin Pashka Stafa, RN on 12/21/2017.
CLINICAL DATA: 57-year-old female presenting for ultrasound-guided
biopsy of a left breast mass.
EXAM:
ULTRASOUND GUIDED LEFT BREAST CORE NEEDLE BIOPSY

[Series 1: us breast bx w loc dev 1st lesion img bx spec us g · 0.07mm/px · 9 of 9 slices shown]
[im 1/9]
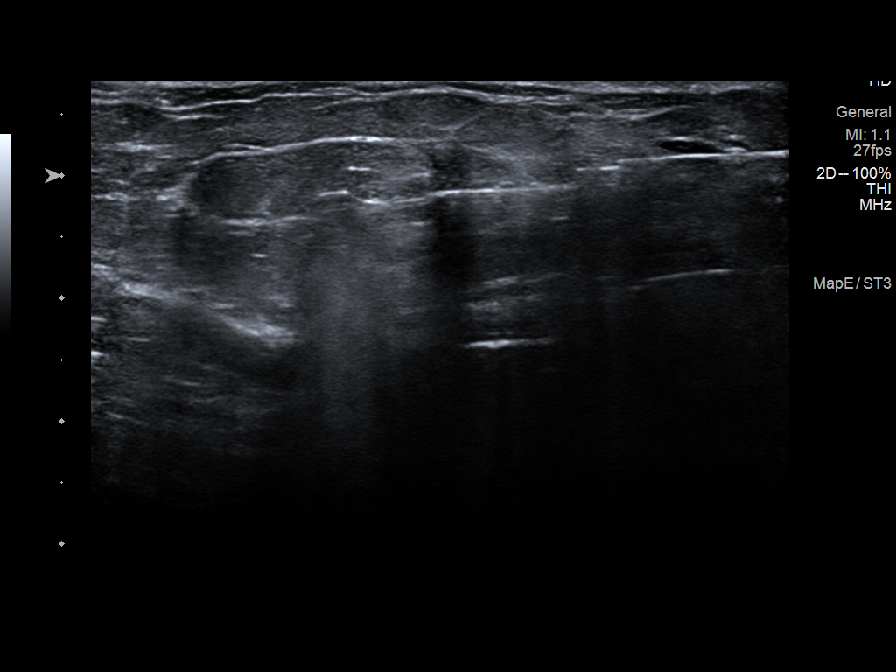
[im 2/9]
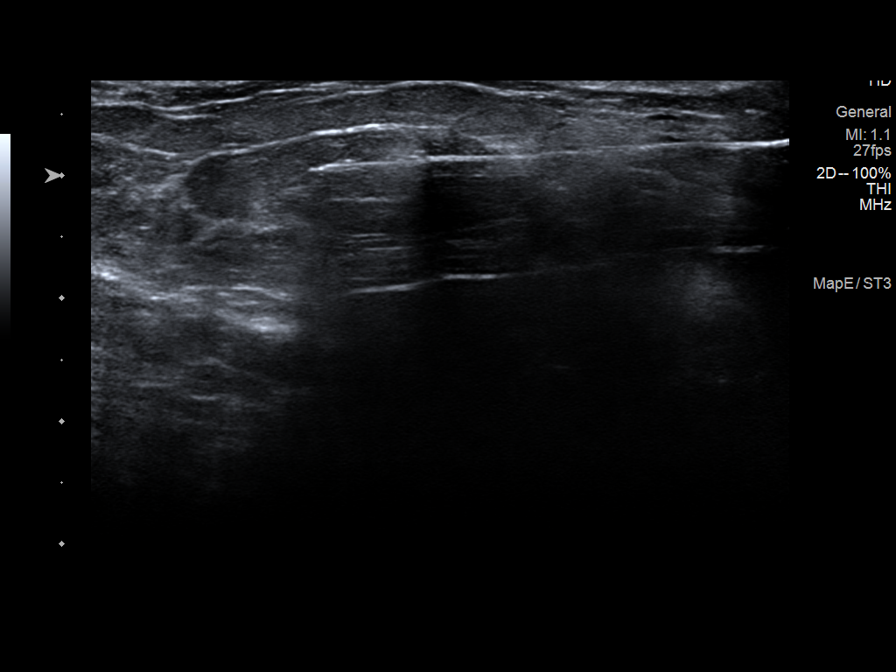
[im 3/9]
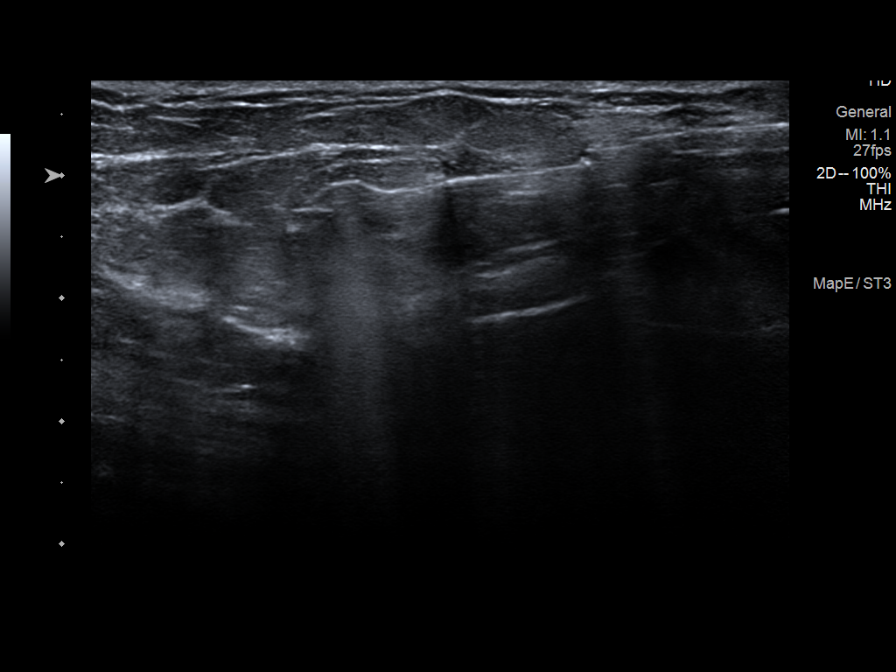
[im 4/9]
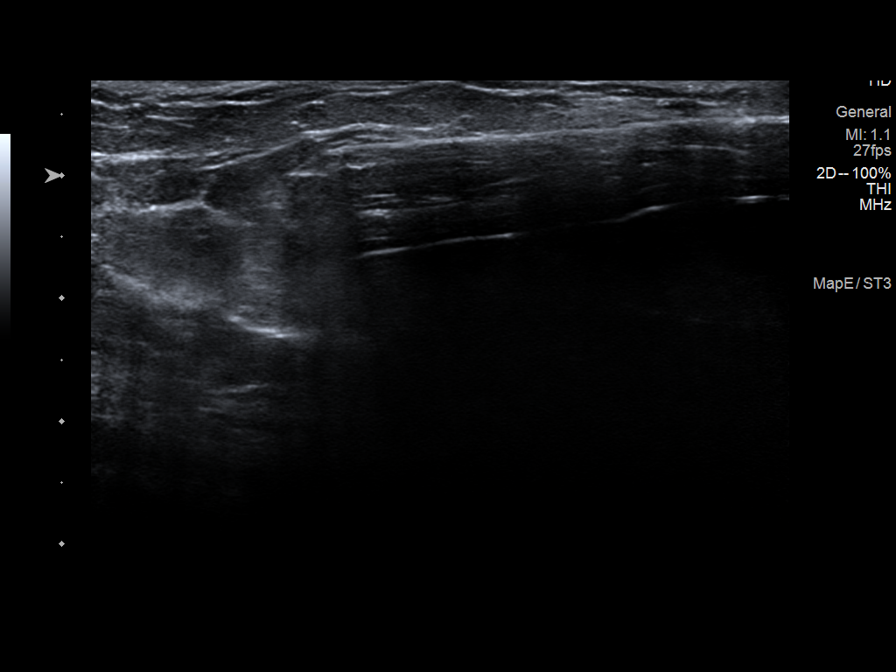
[im 5/9]
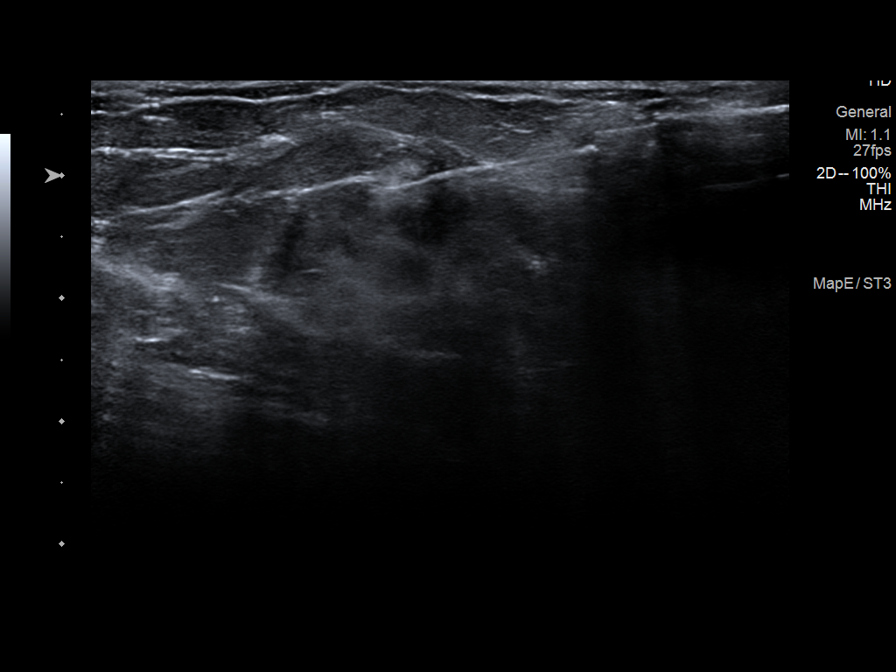
[im 6/9]
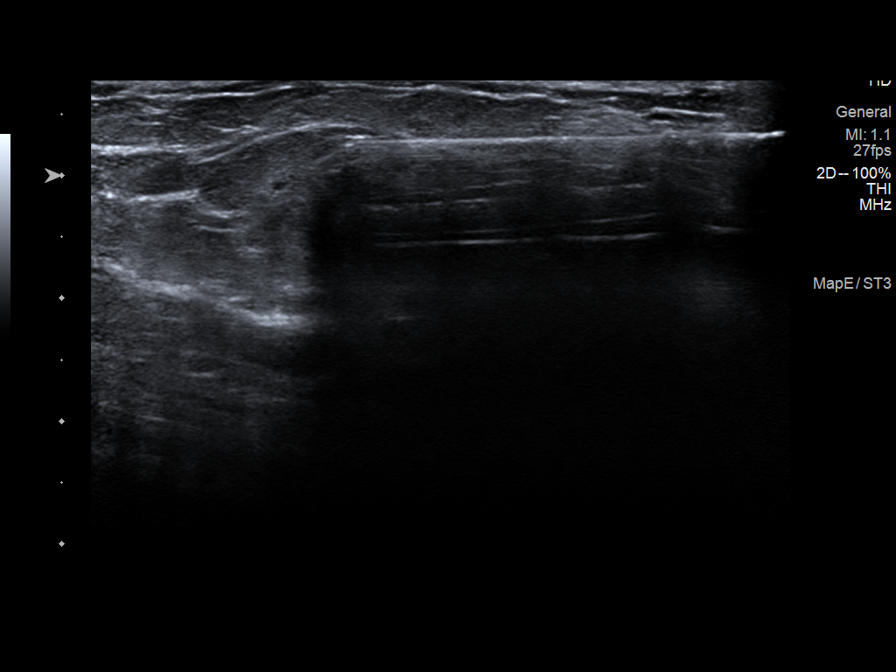
[im 7/9]
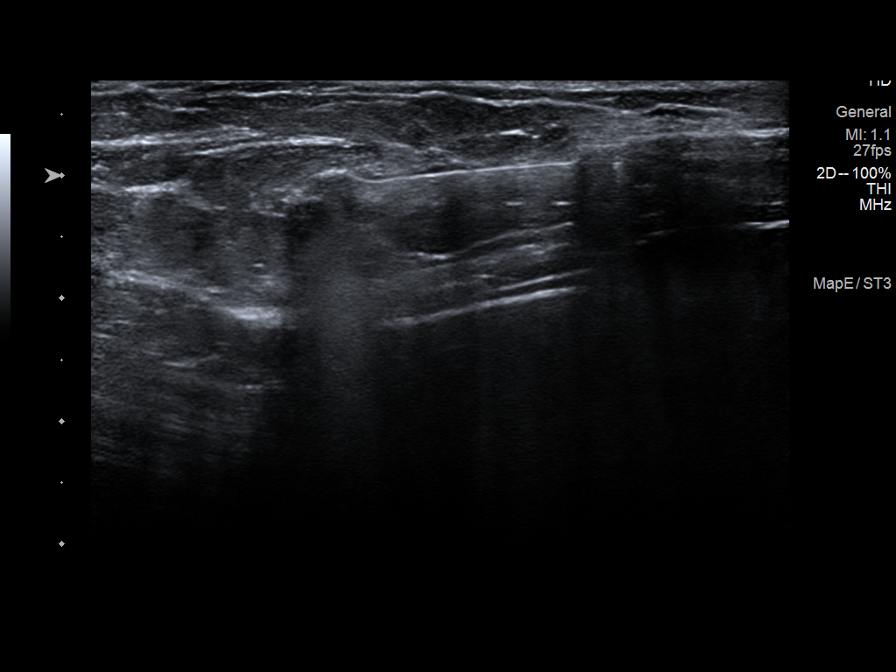
[im 8/9]
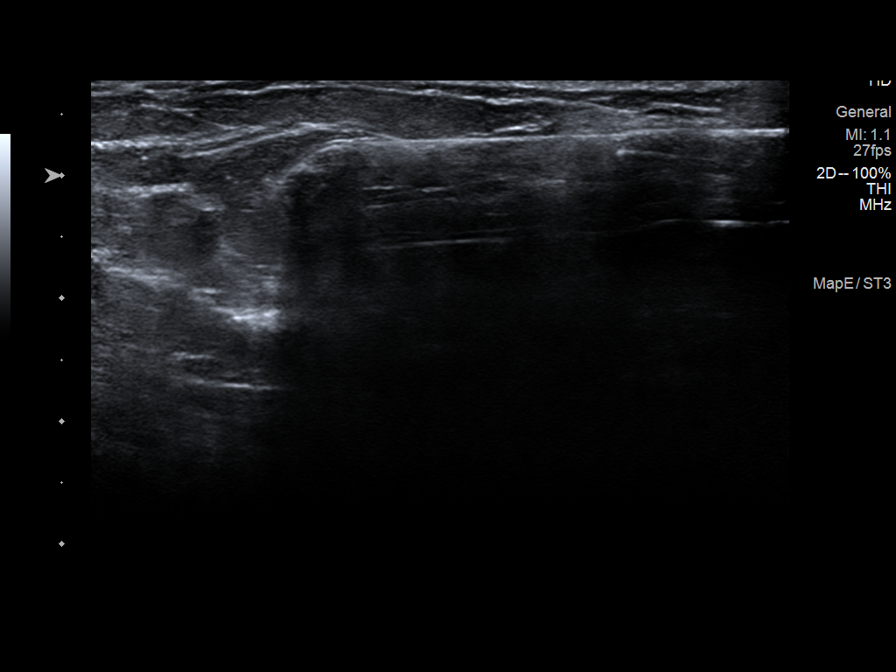
[im 9/9]
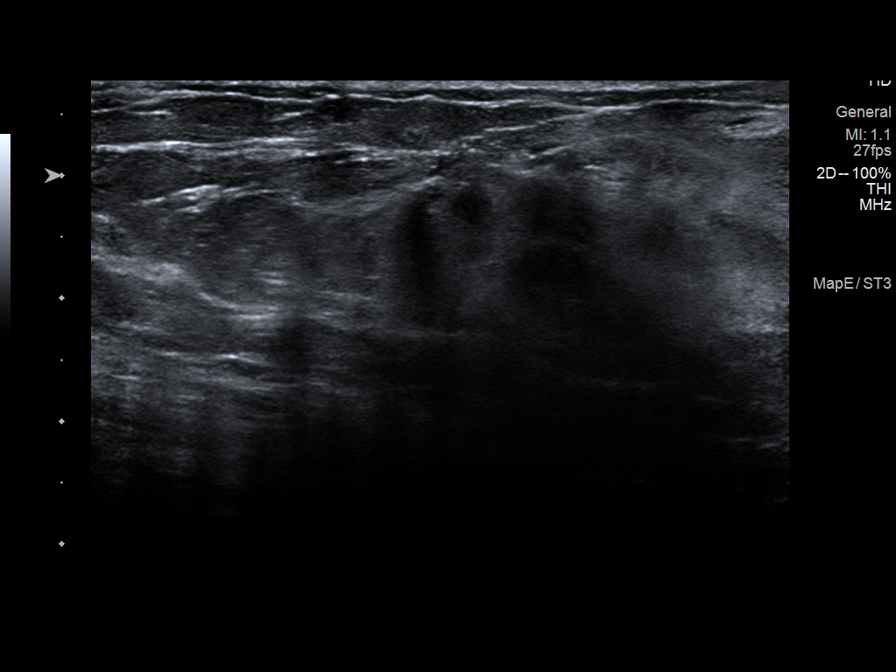

[9 of 9 positions shown; findings below may reference images not displayed]



Lesion quadrant: Upper inner quadrant

Using sterile technique and 1% Lidocaine as local anesthetic, under
direct ultrasound visualization, a 14 gauge [place] device was
used to perform biopsy of a left breast mass at 11 o'clock using a
lateral approach. At the conclusion of the procedure a ribbon shaped
tissue marker clip was deployed into the biopsy cavity. Follow up 2
view mammogram was performed and dictated separately.
IMPRESSION: Ultrasound guided biopsy of left breast mass at 11 o'clock. No
apparent complications.

## 2020-07-16 IMAGING — MG MM CLIP PLACEMENT
4 series · 4 of 12 positions shown · non-contrast
Comparison: Previous exam(s).

CLINICAL DATA: Post biopsy mammogram of the left breast for clip
placement.

EXAM:
DIAGNOSTIC LEFT MAMMOGRAM POST ULTRASOUND BIOPSY

[L ML synth-2D]
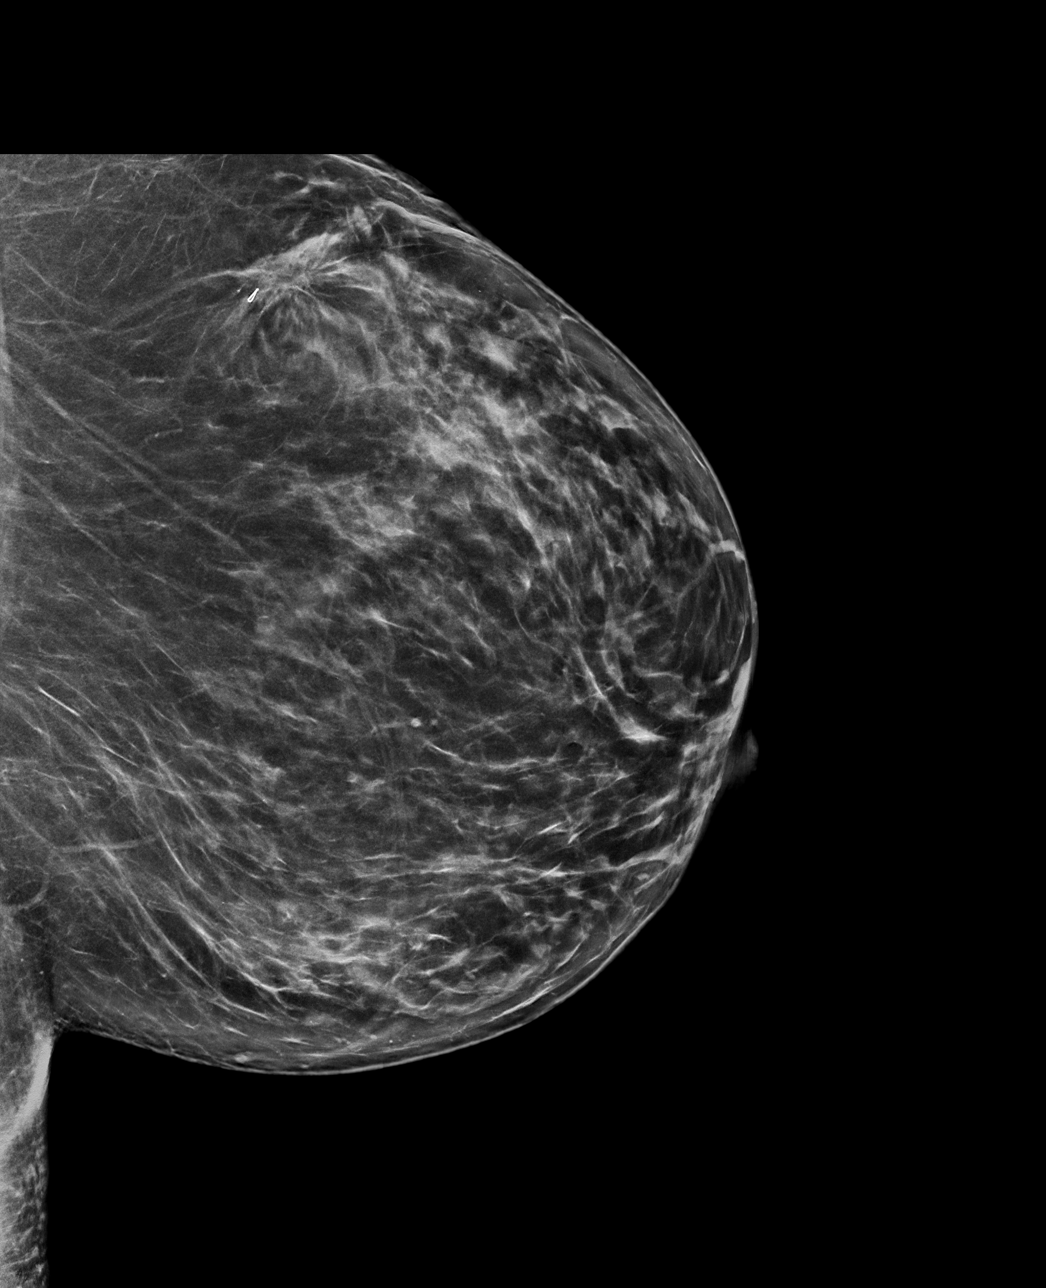

[L CC synth-2D]
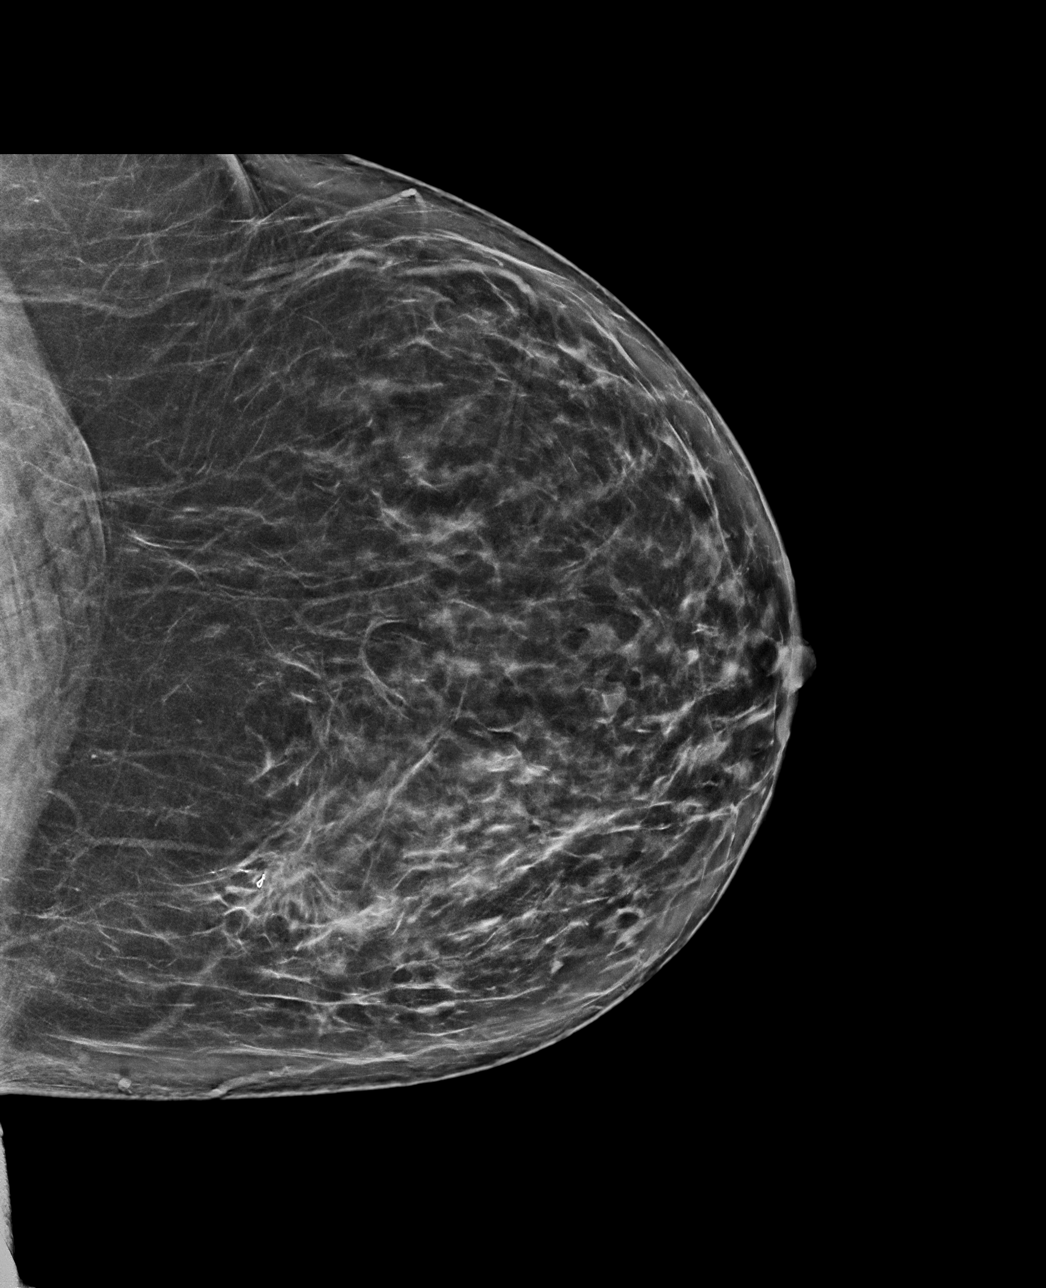

[L CC tomo · tomo slice 42/83.0]
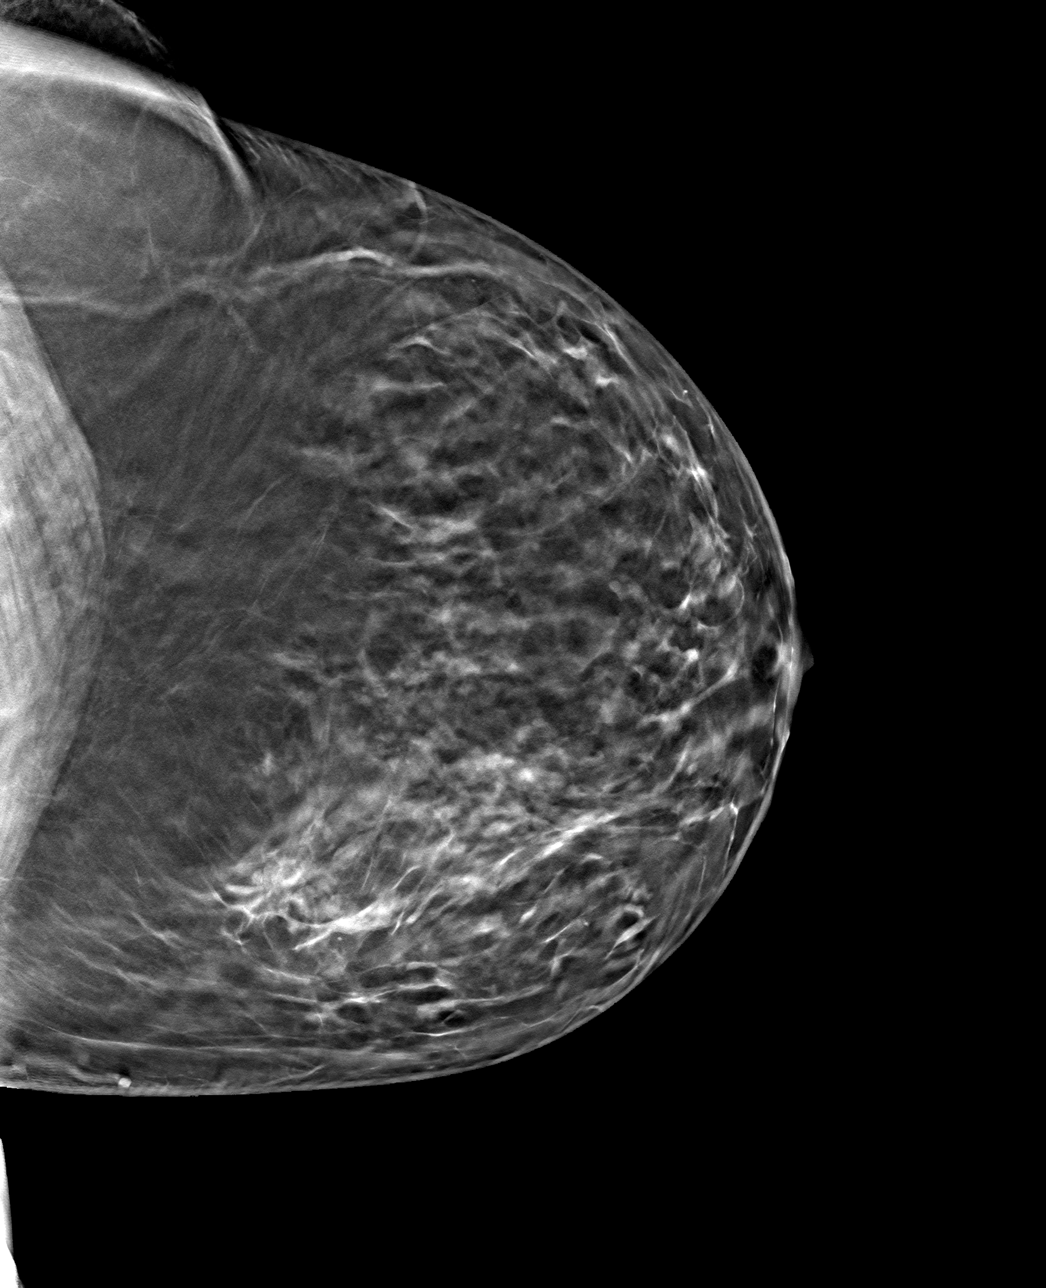

[L ML tomo · tomo slice 42/83.0]
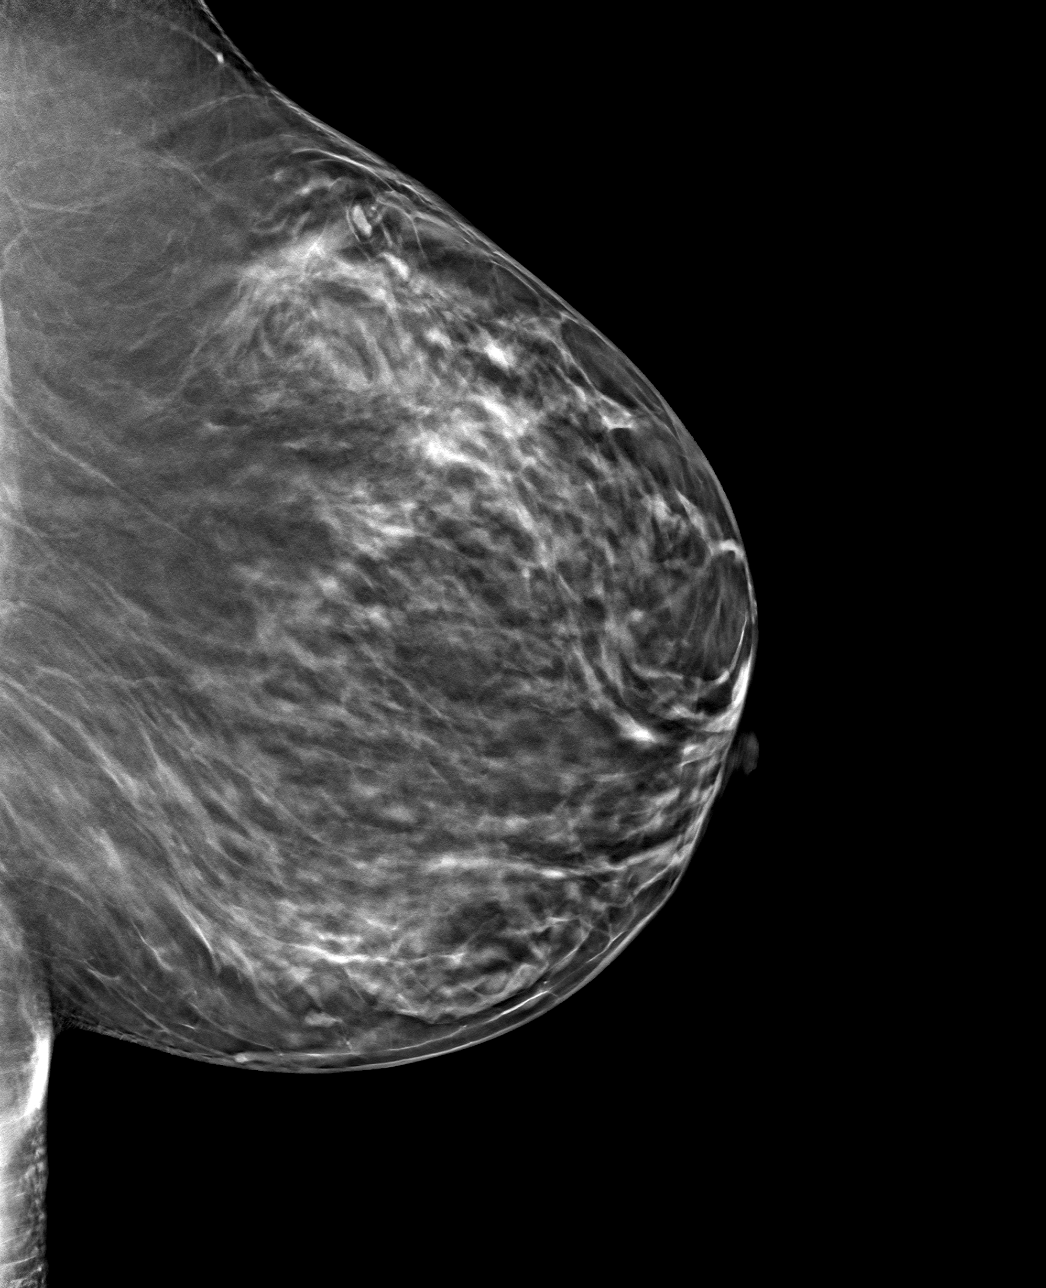

[4 of 12 positions shown; findings below may reference images not displayed]

FINDINGS: Mammographic images were obtained following ultrasound guided biopsy
of a mass in the upper inner left breast. The ribbon shaped biopsy
marking clip is appropriately positioned at the site of biopsy in
the upper inner left breast.
IMPRESSION: Appropriate positioning of the ribbon shaped biopsy marking clip in
the upper inner left breast.

Final Assessment: Post Procedure Mammograms for Marker Placement

## 2022-03-26 HISTORY — PX: KNEE ARTHROPLASTY: SHX992

## 2022-05-28 ENCOUNTER — Ambulatory Visit: Payer: Managed Care, Other (non HMO) | Admitting: Internal Medicine

## 2022-05-28 ENCOUNTER — Encounter: Payer: Self-pay | Admitting: Internal Medicine

## 2022-05-28 VITALS — BP 126/74 | HR 79 | Temp 98.0°F | Resp 18 | Ht 67.0 in | Wt 239.4 lb

## 2022-05-28 DIAGNOSIS — D329 Benign neoplasm of meninges, unspecified: Secondary | ICD-10-CM | POA: Diagnosis not present

## 2022-05-28 MED ORDER — SERTRALINE HCL 50 MG PO TABS: 50.0000 mg | ORAL_TABLET | Freq: Every day | ORAL | 0 refills | Status: DC

## 2022-05-28 NOTE — Progress Notes (Signed)
Office Visit  Subjective   Patient ID: Carly Farmer   DOB: 10-30-1960   Age: 62 y.o.   MRN: [id number]   Chief Complaint Chief Complaint  Patient presents with   Follow-up     History of Present Illness Carly Farmer is a 61 yo female who comes in today for a hospital ER followup.  She was seen in the ER on 05/22/2022 where she tells me she was having 2-3 weeks of mild confusion and forgetfullness but on the day of her visit she began having acute numbness of her lips and fingertips as well as lightheadedness.  She went to the ER where she did have a CT scan of her brain on 05/22/2022 which showed no acute intracranial findings except for a 75m partially calcified extra-axial lesion in the left frontal region suggesting a possible meningioma.  There is no adjacent edema or focal mass effect.  They therefore sent her for a MRI of her head which showed a 135mlateral left frontal convexity meningioma with absent cerebal edema no significant mass effect, otherwise normal for age MRI of the brain.  The ER wanted her to come to see [place] follow[place] of this meningioma and refer her to neurology or neurosurgery.       Past Medical History Past Medical History:  Diagnosis Date   Arthritis    hips and lt knee   Asthma    adult onset   Depression    GAD (generalized anxiety disorder)    History of breast cancer    Hyperlipidemia    Migraine    Prediabetes    Seasonal allergies      Allergies Allergies  Allergen Reactions   Cabbage Hives   Codeine Hives   Percocet [Oxycodone-Acetaminophen] Nausea And Vomiting     Medications  Current Outpatient Medications:    sertraline (ZOLOFT) 50 MG tablet, Take 50 mg by mouth daily., Disp: , Rfl:    Review of Systems Review of Systems  Constitutional:  Negative for chills and fever.  Eyes:  Negative for blurred vision and double vision.  Respiratory:  Negative for cough and shortness of breath.   Cardiovascular:  Negative for chest pain,  palpitations and leg swelling.  Gastrointestinal:  Negative for abdominal pain, constipation, diarrhea, nausea and vomiting.  Neurological:  Negative for dizziness, tingling, tremors, sensory change, speech change, focal weakness, seizures, weakness and headaches.  Psychiatric/Behavioral:  The patient does not have insomnia.        Objective:    Vitals BP 126/74 (BP Location: Right Arm, Patient Position: Sitting, Cuff Size: Large)   Pulse 79   Temp 98 F (36.7 C) (Temporal)   Resp 18   Ht '5\' 7"'$  (1.702 m)   Wt 239 lb 6.4 oz (108.6 kg)   SpO2 96%   BMI 37.50 kg/m    Physical Examination Physical Exam Constitutional:      Appearance: Normal appearance. She is not ill-appearing.  Cardiovascular:     Rate and Rhythm: Normal rate and regular rhythm.     Pulses: Normal pulses.     Heart sounds: No murmur heard.    No friction rub. No gallop.  Pulmonary:     Effort: Pulmonary effort is normal. No respiratory distress.     Breath sounds: No wheezing, rhonchi or rales.  Abdominal:     General: Bowel sounds are normal. There is no distension.     Palpations: Abdomen is soft.     Tenderness: There is  no abdominal tenderness.  Musculoskeletal:     Right lower leg: No edema.     Left lower leg: No edema.  Skin:    General: Skin is warm and dry.     Findings: No rash.  Neurological:     General: No focal deficit present.     Mental Status: She is alert and oriented to person, place, and time.  Psychiatric:        Mood and Affect: Mood normal.        Behavior: Behavior normal.        Assessment & Plan:   Meningioma [facility]) She is not having any symptoms at this time and she states her confusion and forgetfullness is not really occurring at this time.  I will refer her to neurology where they will probably due surveillance of this mengioma.      No follow-ups on file.   Townsend Roger, MD

## 2022-05-28 NOTE — Assessment & Plan Note (Signed)
She is not having any symptoms at this time and she states her confusion and forgetfullness is not really occurring at this time.  I will refer her to neurology where they will probably due surveillance of this mengioma.

## 2022-05-29 ENCOUNTER — Encounter: Payer: Self-pay | Admitting: Physician Assistant

## 2022-06-09 ENCOUNTER — Ambulatory Visit: Payer: 59

## 2022-06-09 ENCOUNTER — Ambulatory Visit: Payer: 59 | Admitting: Physician Assistant

## 2022-06-09 ENCOUNTER — Other Ambulatory Visit (INDEPENDENT_AMBULATORY_CARE_PROVIDER_SITE_OTHER): Payer: 59

## 2022-06-09 ENCOUNTER — Encounter: Payer: Self-pay | Admitting: Physician Assistant

## 2022-06-09 VITALS — BP 149/78 | HR 63 | Ht 67.0 in | Wt 238.0 lb

## 2022-06-09 DIAGNOSIS — D329 Benign neoplasm of meninges, unspecified: Secondary | ICD-10-CM | POA: Diagnosis not present

## 2022-06-09 DIAGNOSIS — R404 Transient alteration of awareness: Secondary | ICD-10-CM | POA: Diagnosis not present

## 2022-06-09 DIAGNOSIS — R413 Other amnesia: Secondary | ICD-10-CM

## 2022-06-09 NOTE — Progress Notes (Cosign Needed)
NEUROLOGY FOLLOW UP OFFICE NOTE  RAINBOW NAIDU [id number]  Assessment/Plan:    Transient alteration of awareness, unclear etiology  This is a very pleasant 62 year old woman with a history of arthritis, asthma, depression, GAD, history of breast cancer, hyperlipidemia, history of migraines, prediabetes presenting for evaluation of transient alteration of awareness.  Patient never had similar symptoms.  No history of seizures.  Patient presented with lightheadedness, some confusion without loss of consciousness, and acute lip and bilateral fingertip numbness and tingling.  CT of the brain on 05/22/2022 was negative for acute intracranial findings except a small partially calcified left frontal region lesion suggestive of meningioma.  MRI of the brain with and without contrast on 05/22/2022 (we have the report but not the actual images), was remarkable for confirmed 13 mm lateral left frontal convexity, likely a meningioma, without send cerebral edema, and no significant mass effect.  Otherwise, normal for age MRI of the brain.  Since discharge from [facility], no recurrence of symptoms were present.  Check EEG for transient alteration of awareness, if normal will consider a 24-hour EEG Check B12 Meningioma guidelines suggest to repeat MRI of the brain once a year for 5 years to monitor increase in size etc.  This can be followed as an outpatient by PCP Follow-up pending on the results    Subjective  While driving taking her grandson to school, 2 weeks ago, "my lips went numb, and the blood was rushing at the tip of my fingers ".  She never had a similar episode.  She denies any history of seizures.  This was preceded by a vertigo event which sounds like positional, as she went to do her hair and had to bend her neck while the hair was being washed.  Patient has a history of migraines, but these was different from her usual ones.  Her headaches are usual the lasting about 3 or 4 hours, in  the frontal area, and stress and standing up aggravated, and she takes a shower and Tylenol and that relieves it.  She does have aura with them.  She denies any metallic or blood taste.  She denies any tremors.  She denies any history of encephalitis or meningitis.  She denies any double vision.  No recent head trauma.  No vision changes.  She denies any loss of smell or taste.  No urine or bowel incontinence.  She denies any difficulty swallowing.  Denies any trouble with ADLs.  She denies any myalgias.  No voice changes.  She denies any dysarthria.  No chest pain, shortness of breath, fever or chills or night sweats.  She denies any new medication or hormonal supplements.  She does not take regular aspirin.  She has a history of anxiety and depression, but they are  no new stressors in personal life.  She is compliant with her medications.  She is very active and exercises daily.  She is not a diabetic.  She drinks significant amount of soda.  Occasionally she smokes.  She denies any alcohol.    PAST MEDICAL HISTORY: Past Medical History:  Diagnosis Date   Arthritis    hips and lt knee   Asthma    adult onset   Depression    GAD (generalized anxiety disorder)    History of breast cancer    Hyperlipidemia    Migraine    Prediabetes    Seasonal allergies     MEDICATIONS: Current Outpatient Medications on File Prior to Visit  Medication Sig Dispense Refill   sertraline (ZOLOFT) 50 MG tablet Take 1 tablet (50 mg total) by mouth daily. 90 tablet 0   No current facility-administered medications on file prior to visit.    ALLERGIES: Allergies  Allergen Reactions   Cabbage Hives   Codeine Hives   Percocet [Oxycodone-Acetaminophen] Nausea And Vomiting    FAMILY HISTORY: Family History  Problem Relation Age of Onset   Prostate cancer Father        late 6s   Bone cancer Father    Breast cancer Maternal Aunt        late 60s   Skin cancer Sister 85       face    .   Objective:    General: No acute distress.  Patient appears   groomed.   Head:  Normocephalic/atraumatic Eyes:  Fundi examined but not visualized Neck: supple, no paraspinal tenderness, full range of motion Heart:  Regular rate and rhythm Lungs:  Clear to auscultation bilaterally Back: No paraspinal tenderness Neurological Exam: alert and oriented to person, place, and time. Attention span and concentration intact, recent and remote memory intact, fund of knowledge intact.  Speech fluent and not dysarthric, language intact.  CN II-XII intact. Bulk and tone normal, muscle strength 5/5 throughout.  Sensation to light touch, temperature and vibration intact.  Deep tendon reflexes 2+ throughout, toes downgoing.  Finger to nose and heel to shin testing intact.  Gait normal, Romberg negative.   Clarise Cruz [facility] 06/09/2022 11:06 AM

## 2022-06-09 NOTE — Patient Instructions (Signed)
EEG B12

## 2022-06-10 ENCOUNTER — Ambulatory Visit (INDEPENDENT_AMBULATORY_CARE_PROVIDER_SITE_OTHER): Payer: 59 | Admitting: Neurology

## 2022-06-10 DIAGNOSIS — R404 Transient alteration of awareness: Secondary | ICD-10-CM | POA: Diagnosis not present

## 2022-06-10 LAB — VITAMIN B12: Vitamin B-12: 267 pg/mL (ref 211–911)

## 2022-06-10 NOTE — Progress Notes (Signed)
B12 is low vitamin B12 is low.  Recommend starting on vitamin B12 1000 mcg daily.  Follow-up with PCP.  Thank you

## 2022-06-10 NOTE — Progress Notes (Signed)
EEG complete - results pending 

## 2022-06-25 NOTE — Procedures (Signed)
ELECTROENCEPHALOGRAM REPORT  Date of Study: 06/10/2022  Patient's Name: Carly Farmer MRN: [id number] Date of Birth: [birth year]  Referring Provider: Marlowe Kays, PA-C  Clinical History: This is a 62 year old woman with an episode of lightheadedness, confusion. EEG for classification.  Medications: Sertraline  Technical Summary: A multichannel digital 1-hour EEG recording measured by the international 10-20 system with electrodes applied with paste and impedances below 5000 ohms performed in our laboratory with EKG monitoring in an awake and drowsy patient.  Hyperventilation and photic stimulation were performed.  The digital EEG was referentially recorded, reformatted, and digitally filtered in a variety of bipolar and referential montages for optimal display.    Description: The patient is awake and drowsy during the recording.  During maximal wakefulness, there is a symmetric, medium voltage 10 Hz posterior dominant rhythm that attenuates with eye opening.  The record is symmetric.  During drowsiness, there is an increase in theta slowing of the background.  Sleep was not captured. Hyperventilation and photic stimulation did not elicit any abnormalities.  There were no epileptiform discharges or electrographic seizures seen.    EKG lead was unremarkable.  Impression: This 1-hour awake and drowsy EEG is normal.    Clinical Correlation: A normal EEG does not exclude a clinical diagnosis of epilepsy.  If further clinical questions remain, prolonged EEG may be helpful.  Clinical correlation is advised.   Patrcia Dolly, M.D.

## 2022-06-26 NOTE — Addendum Note (Signed)
Addended by: Allean Found R on: 06/26/2022 03:14 PM   Modules accepted: Orders

## 2022-07-22 ENCOUNTER — Ambulatory Visit: Payer: 59 | Admitting: Neurology

## 2022-07-22 DIAGNOSIS — R404 Transient alteration of awareness: Secondary | ICD-10-CM

## 2022-07-22 NOTE — Progress Notes (Signed)
Ambulatory EEG hooked up and running. Light flashing. Push button tested. Camera and event log explained. Batteries explained. Patient understood.   

## 2022-08-13 ENCOUNTER — Telehealth: Payer: Self-pay | Admitting: Physician Assistant

## 2022-08-13 NOTE — Procedures (Signed)
ELECTROENCEPHALOGRAM REPORT  Dates of Recording: 07/22/2022 3:31PM to 07/23/2022 4:04PM  Patient's Name: Carly Farmer MRN: [id number] Date of Birth: 03/30/1960  Referring Provider: Dr. Patrcia Dolly  Procedure: 24-hour ambulatory video EEG  History: This is a 62 year old woman with an episode of lightheadedness, confusion. EEG for classification.   Medications: Sertraline  Technical Summary: This is a 24-hour multichannel digital video EEG recording measured by the international 10-20 system with electrodes applied with paste and impedances below 5000 ohms performed as portable with EKG monitoring.  The digital EEG was referentially recorded, reformatted, and digitally filtered in a variety of bipolar and referential montages for optimal display.    DESCRIPTION OF RECORDING: During maximal wakefulness, the background activity consisted of a symmetric 10 Hz posterior dominant rhythm which was reactive to eye opening.  There were no epileptiform discharges or focal slowing seen in wakefulness.  During the recording, the patient progresses through wakefulness, drowsiness, and Stage 2 sleep.  Again, there were no epileptiform discharges seen.  Events: On 5/8 at 0852 hours, she reports "zone out." Patient not on video. Electrographically, there were no EEG or EKG changes seen.  On 5/8 at 1300 hours, she has a sharp pain in frontal head. Patient not on video. Electrographically, there were no EEG or EKG changes seen.  On 5/8 at 1415 hours, she feels nauseous. Patient sitting on recliner with no clinical changes seen. Electrographically, there were no EEG or EKG changes seen.  On 5/8 at 1430 hours, she reports a numb sensation. Patient sitting on recliner with no clinical changes seen. Electrographically, there were no EEG or EKG changes seen.   There were no electrographic seizures seen.  EKG lead was unremarkable.  IMPRESSION: This 24-hour ambulatory video EEG study is normal.    CLINICAL  CORRELATION: A normal EEG does not exclude a clinical diagnosis of epilepsy. Episodes of "zone out," sharp pain in frontal head, nausea, numb sensation, did not show EEG correlate. If further clinical questions remain, inpatient video EEG monitoring may be helpful.   Patrcia Dolly, M.D.

## 2022-08-13 NOTE — Telephone Encounter (Signed)
Not resulted yet

## 2022-08-13 NOTE — Telephone Encounter (Signed)
Pt called in to get her 24 hour EEG results

## 2022-08-13 NOTE — Telephone Encounter (Signed)
No further episodes. FYI

## 2022-08-13 NOTE — Telephone Encounter (Signed)
The 24-hour EEG was normal. Pls let patient know there was no indication of seizure activity on her 24-hour EEG. Has she had any more similar episodes like in March? Thanks

## 2022-08-27 ENCOUNTER — Other Ambulatory Visit: Payer: Self-pay

## 2022-08-27 ENCOUNTER — Other Ambulatory Visit: Payer: Self-pay | Admitting: Internal Medicine

## 2022-08-27 MED ORDER — SERTRALINE HCL 50 MG PO TABS: 50.0000 mg | ORAL_TABLET | Freq: Every day | ORAL | 0 refills | Status: AC

## 2023-09-21 ENCOUNTER — Other Ambulatory Visit: Payer: Self-pay

## 2023-09-21 DIAGNOSIS — R1310 Dysphagia, unspecified: Secondary | ICD-10-CM

## 2023-09-29 ENCOUNTER — Ambulatory Visit
Admission: RE | Admit: 2023-09-29 | Discharge: 2023-09-29 | Disposition: A | Discharge status: Home / Self Care | Source: Ambulatory Visit

## 2023-09-29 DIAGNOSIS — R1310 Dysphagia, unspecified: Secondary | ICD-10-CM
